# Patient Record
Sex: Male | Born: 1970 | Race: White | Hispanic: No | Marital: Married | Smoking: Never smoker
Health system: Southern US, Community
[De-identification: ages and names within clinical notes are randomized; demographics above are authoritative.]

## PROBLEM LIST (undated history)

## (undated) DIAGNOSIS — I83009 Varicose veins of unspecified lower extremity with ulcer of unspecified site: Secondary | ICD-10-CM

## (undated) DIAGNOSIS — K219 Gastro-esophageal reflux disease without esophagitis: Secondary | ICD-10-CM

## (undated) DIAGNOSIS — I82403 Acute embolism and thrombosis of unspecified deep veins of lower extremity, bilateral: Secondary | ICD-10-CM

## (undated) DIAGNOSIS — G4733 Obstructive sleep apnea (adult) (pediatric): Secondary | ICD-10-CM

## (undated) DIAGNOSIS — G4700 Insomnia, unspecified: Secondary | ICD-10-CM

## (undated) DIAGNOSIS — G2581 Restless legs syndrome: Secondary | ICD-10-CM

## (undated) DIAGNOSIS — G629 Polyneuropathy, unspecified: Secondary | ICD-10-CM

## (undated) HISTORY — DX: Gastro-esophageal reflux disease without esophagitis: K21.9

## (undated) HISTORY — DX: Polyneuropathy, unspecified: G62.9

## (undated) HISTORY — DX: Acute embolism and thrombosis of unspecified deep veins of lower extremity, bilateral: I82.403

## (undated) HISTORY — DX: Insomnia, unspecified: G47.00

## (undated) HISTORY — DX: Restless legs syndrome: G25.81

---

## 2002-08-21 HISTORY — PX: OTHER SURGICAL HISTORY: SHX169

## 2012-10-16 ENCOUNTER — Encounter (HOSPITAL_COMMUNITY): Payer: Self-pay | Admitting: Psychiatry

## 2012-10-16 ENCOUNTER — Ambulatory Visit (INDEPENDENT_AMBULATORY_CARE_PROVIDER_SITE_OTHER): Payer: No Typology Code available for payment source | Admitting: Psychiatry

## 2012-10-16 VITALS — BP 129/78 | HR 68 | Ht 70.0 in | Wt 277.0 lb

## 2012-10-16 DIAGNOSIS — F411 Generalized anxiety disorder: Secondary | ICD-10-CM

## 2012-10-16 DIAGNOSIS — F419 Anxiety disorder, unspecified: Secondary | ICD-10-CM

## 2012-10-16 DIAGNOSIS — F0630 Mood disorder due to known physiological condition, unspecified: Secondary | ICD-10-CM

## 2012-10-16 MED ORDER — RISPERIDONE 0.5 MG PO TABS: 1.5000 mg | ORAL_TABLET | Freq: Two times a day (BID) | ORAL | Status: DC

## 2012-10-16 NOTE — Progress Notes (Signed)
Psychiatric Assessment Adult  Patient Identification:  Miguel Obrien  Date of Evaluation:  10/16/2012  Chief Complaint:    Chief Complaint  Patient presents with  . Depression  . Anxiety  . Paranoid    History of Chief Complaint:  HPI Comments: Miguel Obrien is a 42 y/o  male with a past psychiatric history significant for symptoms of paranoia, anxiety and depression. The patient is referred for psychiatric services for psychiatric evaluation and medication management.   The patient reports that his main stressors are:  "employee at work"- The patient reports she "feels like people are talking about me all the time."  She further explains that "people at work use to talk to me, now they go around me and skip me when they talk. Had one supervisor tell me I'm not worth shit." She reports she is "getting to hate work, but I can't quit, so I keep going on."    Onset- Patient reports symptoms started 7-8 years ago. 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         Progression since onset-gradually worsening.         Symptoms-As noted above and below in Psychiatric review of symptoms.          Frequency-daily       Episode duration-minutes to hours.        Severity-mild          Aggravated by work stress.   Hours of sleep per night-varies depending on symptoms of RLS.       Sleep quality-Good to poor depending on RLS,           Nighttime awakenings-0-1 depending on symptoms of RLS.            Risk factors factors-none.  PMH includes-GERD, Neuropathy,DVT, RLS, Insomnia          Treatments tried-SSRIs, atypical antipsychotics   Compliance with treatment-100%           Improvement on treatment-mild           Past compliance problems-medication issues.     Review of Systems  Constitutional: Negative for fever, chills, activity change, appetite change and  fatigue.  Respiratory: Negative for cough, choking, chest tightness and shortness of breath.   Cardiovascular: Negative for chest pain, palpitations and leg swelling.  Gastrointestinal: Negative for nausea, vomiting, abdominal pain, diarrhea, constipation, blood in stool and abdominal distention.  Neurological: Positive for weakness (Due to periperal neuropathy in legs.). Negative for dizziness, light-headedness, numbness and headaches.   Filed Vitals:   10/16/12 0920  BP: 129/78  Pulse: 68  Height: 5\' 10"  (1.778 m)  Weight: 277 lb (125.646 kg)   Physical Exam  Vitals reviewed. Constitutional: He appears well-developed and well-nourished. No distress.  Skin: He is not diaphoretic.  Musculoskeletal: Strength & Muscle Tone: within normal limits Gait & Station: normal Patient leans: N/A   Depressive Symptoms: disturbed sleep,  (Hypo) Manic Symptoms:   Elevated Mood:  Negative Irritable Mood:  Negative Grandiosity:  Negative Distractibility:  Negative Labiality of Mood:  Negative Delusions:  Negative Hallucinations:  Negative Impulsivity:  Negative Sexually Inappropriate Behavior:  Negative Financial Extravagance:  Negative Flight of Ideas:  Negative  Anxiety Symptoms: Excessive Worry:  Yes Panic Symptoms:  Negative Agoraphobia:  Negative Obsessive Compulsive: Yes  Symptoms: Intrusive thoughts Specific Phobias:  Negative Social Anxiety:  Negative  Psychotic Symptoms:  Hallucinations: No None Delusions:  Yes-5 years ago, thought cameras were watching him for 4-5 months Paranoia:  Yes-people talking about him.   Ideas of Reference:  Negative  PTSD Symptoms: Ever had a traumatic exposure:  Negative Had a traumatic exposure in the last month:  Negative Re-experiencing: Negative None Hypervigilance:  Negative Hyperarousal: Negative None Avoidance: No None  Traumatic Brain Injury: Negative   Past Psychiatric History: Diagnosis: No previous psychiatric diagnosis.   Hospitalizations: Patient denies.  Outpatient Care: Patient denies.  Substance Abuse Care: Patient denies.  Self-Mutilation: Patient denies.  Suicidal Attempts: Patient denies.  Violent Behaviors: Patient denies.   Past Medical History:   Past Medical History  Diagnosis Date  . GERD (gastroesophageal reflux disease)     Diagnosed 4-5 years  . DVT, bilateral lower limbs About 10 years  . Peripheral neuropathy About 2 years  . Restless legs syndrome (RLS)   . Insomnia     History of Loss of Consciousness:  No Seizure History:  Yes-History of Absence seizures Cardiac History:  Negative  Allergies:  No Known Allergies  Current Medications:  Current Outpatient Prescriptions  Medication Sig Dispense Refill  . FLUoxetine (PROZAC) 40 MG capsule Take 40 mg by mouth every morning.      . gabapentin (NEURONTIN) 300 MG capsule Take 300 mg by mouth 2 (two) times daily.      . pantoprazole (PROTONIX) 40 MG tablet Take 40 mg by mouth daily.      . risperiDONE (RISPERDAL) 0.5 MG tablet Take 0.5 mg by mouth 2 (two) times daily.      Marland Kitchen warfarin (COUMADIN) 5 MG tablet Take 5 mg by mouth daily. 7.5 mg Monday to Friday, and 10 mg Saturday and Sunday.      . zolpidem (AMBIEN) 5 MG tablet Take 5 mg by mouth at bedtime as needed for sleep.       No current facility-administered medications for this visit.    Previous Psychotropic Medications:  Medication Dose   Zolpidem  5 mg  Abilify-Expensive-  Unknown possible 10 mg  Risperidone  1 mg  Fluoextine  40 mg   Substance Abuse History in the last 12 months: Caffeine: Coffee 1.5  cup per day. Nicotine: Patient denies.  Alcohol: Patient denies.  Illicit Drugs: Patient denies.   Medical Consequences of Substance Abuse: Patient denies.   Legal Consequences of Substance Abuse: Patient denies.   Family Consequences of Substance Abuse: Patient denies.   Blackouts:  Negative DT's:  Negative Withdrawal Symptoms:  Negative None  Social  History: Current Place of Residence: [place], [place] Place of Birth: [place], [place] Family Members: Patient lives with his. Marital Status:  Married Children: 1  Daughters: 1- from another woman Relationships: Patient reports his wife and mother are his main sources of emotional support. Education:  College-1 year Educational Problems/Performance: Fair Religious Beliefs/Practices: Patient prays. 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 Legal History: None Hobbies/Interests: Patient has fish aquarium  Family History:  History reviewed. No pertinent family history.  Mental Status Examination/Evaluation: Objective:  Appearance: Casual  Eye Contact::  Good  Speech:  Clear and Coherent and Normal Rate  Volume:  Normal  Mood:  "pretty good" 8/10  (0=Very depressed; 5=Neutral; 10=Very Happy)   Affect:  Appropriate, Congruent and Full Range  Thought Process:  Coherent, Linear and Logical  Orientation:  Full (Time, Place, and Person)  Thought Content:  Obsessions and Paranoid Ideation  Suicidal Thoughts:  No  Homicidal  Thoughts:  No  Judgement:  Fair  Insight:  Present  Psychomotor Activity:  Negative  Akathisia:  Yes  Handed:  Right  AIMS (if indicated):    Assets:  Communication Skills Desire for Improvement Financial Resources/Insurance Housing Intimacy Leisure Time Physical Health Resilience Social Support Talents/Skills Transportation Vocational/Educational    Laboratory/X-Ray Psychological Evaluation(s)   None  None   Assessment:   AXIS I Anxiety Disorder NOS rule out Mood Disorder Due to general medical condition.  AXIS II No diagnosis  AXIS III Past Medical History  Diagnosis Date  . GERD (gastroesophageal reflux disease)     Diagnosed 4-5 years  . DVT, bilateral lower limbs About 10 years  . Peripheral neuropathy About 2 years  . Restless legs syndrome (RLS)   . Insomnia      AXIS IV occupational  problems and other psychosocial or environmental problems  AXIS V 41-50 serious symptoms   Treatment Plan/Recommendations:   Plan of Care:  PLAN:  1. Affirm with the patient that the medications are taken as ordered. Patient  expressed understanding of how their medications were to be used.    Laboratory:   No labs warranted at this time.   Psychotherapy: Therapy: brief supportive therapy provided. Continue current services.   Medications:  Start the following psychiatric medications as written prior to this appointment:  a) Continue Prozac 40 mg b) Increase Risperidone 1.5  mg daily-Asked patient to report results in a week. -Risks and benefits, side effects and alternatives discussed with patient, he was given an opportunity to ask questions about his medication, illness, and treatment. All current psychiatric medications have been reviewed and discussed with the patient and adjusted as clinically appropriate. The patient has been provided an accurate and updated list of the medications being now prescribed.   Routine PRN Medications:  Negative  Consultations: The patient was encouraged to keep all PCP and specialty clinic appointments.   Safety Concerns:   Patient told to call clinic if any problems occur. Patient advised to go to  ER  if he should develop SI/HI, side effects, or if symptoms worsen. Has crisis numbers to call if needed.    Other:   8. Patient was instructed to return to clinic in 1 months.  9. The patient was advised to call and cancel their mental health appointment within 24 hours of the appointment, if they are unable to keep the appointment, as well as the three no show and termination from clinic policy. 10. The patient expressed understanding of the plan and agrees with the above.   Jacqulyn Cane, MD 5/28/20148:53 AM

## 2012-10-18 ENCOUNTER — Encounter (HOSPITAL_COMMUNITY): Payer: Self-pay | Admitting: Psychiatry

## 2012-10-18 DIAGNOSIS — F419 Anxiety disorder, unspecified: Secondary | ICD-10-CM | POA: Insufficient documentation

## 2012-10-18 DIAGNOSIS — F0630 Mood disorder due to known physiological condition, unspecified: Secondary | ICD-10-CM | POA: Insufficient documentation

## 2012-11-08 ENCOUNTER — Ambulatory Visit (HOSPITAL_COMMUNITY): Payer: Self-pay | Admitting: Psychiatry

## 2012-11-15 ENCOUNTER — Encounter (HOSPITAL_COMMUNITY): Payer: Self-pay | Admitting: Psychiatry

## 2012-11-15 ENCOUNTER — Ambulatory Visit (INDEPENDENT_AMBULATORY_CARE_PROVIDER_SITE_OTHER): Payer: No Typology Code available for payment source | Admitting: Psychiatry

## 2012-11-15 VITALS — BP 113/72 | HR 80 | Ht 70.0 in | Wt 278.0 lb

## 2012-11-15 DIAGNOSIS — F0630 Mood disorder due to known physiological condition, unspecified: Secondary | ICD-10-CM

## 2012-11-15 DIAGNOSIS — F411 Generalized anxiety disorder: Secondary | ICD-10-CM

## 2012-11-15 MED ORDER — FLUOXETINE HCL 40 MG PO CAPS: 40.0000 mg | ORAL_CAPSULE | ORAL | Status: DC

## 2012-11-15 MED ORDER — RISPERIDONE 0.5 MG PO TABS: 2.0000 mg | ORAL_TABLET | Freq: Every day | ORAL | Status: DC

## 2012-11-15 NOTE — Progress Notes (Signed)
[facility] Follow-up Outpatient Visit   Patient Identification:  Miguel Obrien  Date of Evaluation:  11/15/2012  Chief Complaint:    Chief Complaint  Patient presents with  . Follow-up    History of Chief Complaint:  HPI Comments: Mr. Miguel Obrien is a 42 y/o  male with a past psychiatric history significant for symptoms of paranoia, anxiety and depression. The patient is referred for psychiatric services for medication management.   The patient reports that he has been feeling better particularly with symptoms of paranoia since an increase of Risperidine to 1.5 mg.  The patient reports his appetite is fair.    The patient states he is taking all his medications and denies any side effects from his medications. He denies any other complaints.  In the area of affective symptoms, patient appears euthymic. Patient denies current suicidal ideation, intent, or plan. Patient denies current homicidal ideation, intent, or plan. Patient denies auditory hallucinations. Patient denies visual hallucinations. Patient denies symptoms of paranoia. Patient states sleep is good, with approximately 6-7 hours of sleep per night. Appetite is good. Energy level is fair. Patient denies symptoms of anhedonia. Patient denies hopelessness, helplessness, or guilt.   Denies any recent episodes consistent with mania, particularly decreased need for sleep with increased energy, grandiosity, impulsivity, hyperverbal and pressured speech, or increased productivity. Denies any recent symptoms consistent with psychosis, particularly auditory or visual hallucinations, thought broadcasting/insertion/withdrawal, or ideas of reference. Also denies excessive worry to the point of physical symptoms as well as any panic attacks. Denies any history of trauma or symptoms consistent with PTSD such as flashbacks, nightmares, hypervigilance, feelings of numbness or inability to connect with others.   Onset- Patient reports symptoms  started 7-8 years ago. 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         Progression since onset-gradually worsening.         Symptoms-As noted above and below in Psychiatric review of symptoms.          Frequency-daily       Episode duration-minutes to hours.        Severity-mild          Aggravated by work stress.   Hours of sleep per night-varies depending on symptoms of RLS.       Sleep quality-Good to poor depending on RLS,           Nighttime awakenings-0-1 depending on symptoms of RLS.            Risk factors factors-none.  PMH includes-GERD, Neuropathy,DVT, RLS, Insomnia          Treatments tried-SSRIs, atypical antipsychotics   Compliance with treatment-100%           Improvement on treatment-mild           Past compliance problems-medication issues.     Review of Systems  Constitutional: Negative for fever, chills, activity change, appetite change and fatigue.  Respiratory: Negative for cough, choking, chest tightness and shortness of breath.   Cardiovascular: Negative for chest pain, palpitations and leg swelling.  Gastrointestinal: Negative for nausea, vomiting, abdominal pain, diarrhea, constipation, blood in stool and abdominal distention.  Neurological: Positive for weakness (Due to periperal neuropathy in legs.). Negative for dizziness, light-headedness, numbness and headaches.   Filed Vitals:   11/15/12 1543  BP: 113/72  Pulse: 80  Height: 5\' 10"  (1.778 m)  Weight:  278 lb (126.1 kg)    Physical Exam  Vitals reviewed. Constitutional: He appears well-developed and well-nourished. No distress.  Skin: He is not diaphoretic.  Musculoskeletal: Strength & Muscle Tone: within normal limits Gait & Station: normal Patient leans: N/A   Depressive Symptoms: disturbed sleep,  (Hypo) Manic Symptoms:   Elevated Mood:  Negative Irritable Mood:   Negative Grandiosity:  Negative Distractibility:  Negative Labiality of Mood:  Negative Delusions:  Negative Hallucinations:  Negative Impulsivity:  Negative Sexually Inappropriate Behavior:  Negative Financial Extravagance:  Negative Flight of Ideas:  Negative  Anxiety Symptoms: Excessive Worry:  Yes Panic Symptoms:  Negative Agoraphobia:  Negative Obsessive Compulsive: Yes  Symptoms: Intrusive thoughts Specific Phobias:  Negative Social Anxiety:  Negative  Psychotic Symptoms:  Hallucinations: No None Delusions:  Yes-5 years ago, thought cameras were watching him for 4-5 months Paranoia:  Yes-people talking about him.   Ideas of Reference:  Negative  PTSD Symptoms: Ever had a traumatic exposure:  Negative Had a traumatic exposure in the last month:  Negative Re-experiencing: Negative None Hypervigilance:  Negative Hyperarousal: Negative None Avoidance: No None  Traumatic Brain Injury: Negative   Past Psychiatric History: Diagnosis: No previous psychiatric diagnosis.  Hospitalizations: Patient denies.  Outpatient Care: Patient denies.  Substance Abuse Care: Patient denies.  Self-Mutilation: Patient denies.  Suicidal Attempts: Patient denies.  Violent Behaviors: Patient denies.   Past Medical History:   Past Medical History  Diagnosis Date  . GERD (gastroesophageal reflux disease)     Diagnosed 4-5 years  . DVT, bilateral lower limbs About 10 years  . Peripheral neuropathy About 2 years  . Restless legs syndrome (RLS)   . Insomnia     History of Loss of Consciousness:  No Seizure History:  Yes-History of Absence seizures Cardiac History:  Negative  Allergies:  No Known Allergies  Current Medications:  Current Outpatient Prescriptions  Medication Sig Dispense Refill  . FLUoxetine (PROZAC) 40 MG capsule Take 40 mg by mouth every morning.      . gabapentin (NEURONTIN) 300 MG capsule Take 300 mg by mouth 2 (two) times daily.      . pantoprazole  (PROTONIX) 40 MG tablet Take 40 mg by mouth daily.      . risperiDONE (RISPERDAL) 0.5 MG tablet Take 3 tablets (1.5 mg total) by mouth 2 (two) times daily.  1 tablet  0  . warfarin (COUMADIN) 5 MG tablet Take 5 mg by mouth daily. 7.5 mg Monday to Friday, and 10 mg Saturday and Sunday.       No current facility-administered medications for this visit.    Previous Psychotropic Medications:  Medication Dose   Zolpidem  5 mg  Abilify-Expensive-  Unknown possible 10 mg  Risperidone  1 mg  Fluoextine  40 mg   Substance Abuse History in the last 12 months: Caffeine: Coffee 1.5  cup per day. Nicotine: Patient denies.  Alcohol: Patient denies.  Illicit Drugs: Patient denies.   Medical Consequences of Substance Abuse: Patient denies.   Legal Consequences of Substance Abuse: Patient denies.   Family Consequences of Substance Abuse: Patient denies.   Blackouts:  Negative DT's:  Negative Withdrawal Symptoms:  Negative None  Social History: Current Place of Residence: [place], [place] Place of Birth: [place], [place] Family Members: Patient lives with his. Marital Status:  Married Children: 1  Daughters: 1- from another woman Relationships: Patient reports his wife and mother are his main sources of emotional support. Education:  College-1 year Educational Problems/Performance: Fair Religious  Beliefs/Practices: Patient prays. 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 Legal History: None Hobbies/Interests: Patient has fish aquarium  Family History:   Family History  Problem Relation Age of Onset  . Diabetes Mellitus II Mother   . Diabetes Mellitus II Father   . Deep vein thrombosis Brother     Psychiatric Specialty Examination: Objective:  Appearance: Casual  Eye Contact::  Good  Speech:  Clear and Coherent and Normal Rate  Volume:  Normal  Mood:  "pretty good" 8/10  (0=Very depressed; 5=Neutral; 10=Very Happy)   Affect:   Appropriate, Congruent and Full Range  Thought Process:  Coherent, Linear and Logical  Orientation:  Full (Time, Place, and Person)  Thought Content:  Obsessions and Paranoid Ideation  Suicidal Thoughts:  No  Homicidal Thoughts:  No  Judgement:  Fair  Insight:  Present  Psychomotor Activity:  Negative  Akathisia:  Yes  Handed:  Right  Memory: Immediate: 3/3; Immediate 1/3  AIMS (if indicated):  As noted  Assets:  Communication Skills Desire for Improvement Financial Resources/Insurance Housing Intimacy Leisure Time Physical Health Resilience Social Support Talents/Skills Transportation Vocational/Educational    Laboratory/X-Ray Psychological Evaluation(s)   None  None   Assessment:   AXIS I Anxiety Disorder NOS rule out Mood Disorder Due to general medical condition.  AXIS II No diagnosis  AXIS III Past Medical History  Diagnosis Date  . GERD (gastroesophageal reflux disease)     Diagnosed 4-5 years  . DVT, bilateral lower limbs About 10 years  . Peripheral neuropathy About 2 years  . Restless legs syndrome (RLS)   . Insomnia      AXIS IV occupational problems and other psychosocial or environmental problems  AXIS V 41-50 serious symptoms   Treatment Plan/Recommendations:   Plan of Care:  PLAN:  1. Affirm with the patient that the medications are taken as ordered. Patient  expressed understanding of how their medications were to be used.    Laboratory:   No labs warranted at this time.   Psychotherapy: Therapy: brief supportive therapy provided. Continue current services.   Medications:  Start the following psychiatric medications as written prior to this appointment:  a) Continue Prozac 40 mg b) Increase Risperidone 2 mg daily-Asked patient to report results in a week. -Risks and benefits, side effects and alternatives discussed with patient, he was given an opportunity to ask questions about his medication, illness, and treatment. All current psychiatric  medications have been reviewed and discussed with the patient and adjusted as clinically appropriate. The patient has been provided an accurate and updated list of the medications being now prescribed.   Routine PRN Medications:  Negative  Consultations: The patient was encouraged to keep all PCP and specialty clinic appointments.   Safety Concerns:   Patient told to call clinic if any problems occur. Patient advised to go to  ER  if he should develop SI/HI, side effects, or if symptoms worsen. Has crisis numbers to call if needed.    Other:   8. Patient was instructed to return to clinic in 6 weeks.  9. The patient was advised to call and cancel their mental health appointment within 24 hours of the appointment, if they are unable to keep the appointment, as well as the three no show and termination from clinic policy. 10. The patient expressed understanding of the plan and agrees with the above.   Jacqulyn Cane, MD 6/27/20143:41 PM

## 2012-12-27 ENCOUNTER — Encounter (HOSPITAL_COMMUNITY): Payer: Self-pay | Admitting: Psychiatry

## 2012-12-27 ENCOUNTER — Ambulatory Visit (INDEPENDENT_AMBULATORY_CARE_PROVIDER_SITE_OTHER): Payer: No Typology Code available for payment source | Admitting: Psychiatry

## 2012-12-27 VITALS — BP 125/75 | HR 75 | Ht 70.0 in | Wt 289.0 lb

## 2012-12-27 DIAGNOSIS — F411 Generalized anxiety disorder: Secondary | ICD-10-CM

## 2012-12-27 DIAGNOSIS — F0630 Mood disorder due to known physiological condition, unspecified: Secondary | ICD-10-CM

## 2012-12-27 MED ORDER — FLUOXETINE HCL 40 MG PO CAPS: 40.0000 mg | ORAL_CAPSULE | ORAL | Status: DC

## 2012-12-27 MED ORDER — RISPERIDONE 2 MG PO TABS: 2.0000 mg | ORAL_TABLET | Freq: Every day | ORAL | Status: DC

## 2012-12-27 NOTE — Progress Notes (Signed)
[facility] Follow-up Outpatient Visit   Patient Identification:  Miguel Obrien  Date of Evaluation:  12/27/2012  Chief Complaint:    Chief Complaint  Patient presents with  . 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 The patient is referred for psychiatric services for medication management.   The patient reports he has been doing better with the increase in Risperidone 2 mg.  He continues to take 40 mg of fluoxetine. He reports he is talking to people at work and able to interact with people he had difficulty with before. The patient states he is taking all his medications and denies any side effects from his medications. He denies any other complaints.   In the area of affective symptoms, patient appears euthymic. Patient denies current suicidal ideation, intent, or plan. Patient denies current homicidal ideation, intent, or plan. Patient denies auditory hallucinations. Patient denies visual hallucinations. Patient denies symptoms of paranoia. Patient states sleep is good, with approximately 7 hours of sleep per night. Appetite is good.  Energy level is good. Patient denies symptoms of anhedonia. Patient denies hopelessness, helplessness, or guilt.      Progression since onset-gradually improving         Symptoms-As noted above and below in Psychiatric review of symptoms.          Frequency-daily       Episode duration-minutes to hours.        Severity-mild          Aggravated-nothing.   Hours of sleep per night-varies depending on symptoms of RLS.       Sleep quality-Good to poor depending on RLS,           Nighttime awakenings-0-1 depending on symptoms of RLS.            Risk factors factors-none.  PMH includes-GERD, Neuropathy,DVT, RLS, Insomnia           Treatments tried-SSRIs, atypical antipsychotics   Compliance with treatment-100%           Improvement on treatment-significant.        Past compliance problems-medication issues.     Review of Systems  Constitutional: Negative for fever, chills, activity change, appetite change and fatigue.  Respiratory: Negative for cough, choking, chest tightness and shortness of breath.   Cardiovascular: Negative for chest pain, palpitations and leg swelling.  Gastrointestinal: Negative for nausea, vomiting, abdominal pain, diarrhea, constipation, blood in stool and abdominal distention.  Neurological: Positive for weakness (Due to periperal neuropathy in legs.). Negative for dizziness, light-headedness, numbness and headaches.   Filed Vitals:   12/27/12 1518  BP: 125/75  Pulse: 75  Height: 5\' 10"  (1.778 m)  Weight: 289 lb (131.09 kg)    Physical Exam  Vitals reviewed. Constitutional: He appears well-developed and well-nourished. No distress.  Skin: He is not diaphoretic.  Musculoskeletal: Strength & Muscle Tone: within normal limits Gait & Station: normal Patient leans: N/A   Depressive Symptoms: disturbed sleep,  (Hypo) Manic Symptoms:   Elevated Mood:  Negative Irritable Mood:  Negative Grandiosity:  Negative Distractibility:  Negative Labiality of Mood:  Negative Delusions:  Negative Hallucinations:  Negative Impulsivity:  Negative Sexually Inappropriate Behavior:  Negative Financial Extravagance:  Negative Flight of Ideas:  Negative  Anxiety Symptoms: Excessive Worry:  Negative Panic Symptoms:  Negative Agoraphobia:  Negative Obsessive Compulsive: No  Symptoms: None, Specific Phobias:  Negative Social Anxiety:  Negative  Psychotic Symptoms:  Hallucinations: No None Delusions:  Yes-5 years ago, thought cameras were watching him for 4-5 months Paranoia:  Yes-people talking about him.   Ideas of Reference:  Negative  PTSD Symptoms: Ever had a traumatic  exposure:  Negative Had a traumatic exposure in the last month:  Negative Re-experiencing: Negative None Hypervigilance:  Negative Hyperarousal: Negative None Avoidance: No None  Traumatic Brain Injury: Negative   Past Psychiatric History: Diagnosis: No previous psychiatric diagnosis.  Hospitalizations: Patient denies.  Outpatient Care: Patient denies.  Substance Abuse Care: Patient denies.  Self-Mutilation: Patient denies.  Suicidal Attempts: Patient denies.  Violent Behaviors: Patient denies.   Past Medical History:   Past Medical History  Diagnosis Date  . GERD (gastroesophageal reflux disease)     Diagnosed 4-5 years  . DVT, bilateral lower limbs About 10 years  . Peripheral neuropathy About 2 years  . Restless legs syndrome (RLS)   . Insomnia     History of Loss of Consciousness:  No Seizure History:  Yes-History of Absence seizures Cardiac History:  Negative  Allergies:  No Known Allergies  Current Medications:  Current Outpatient Prescriptions  Medication Sig Dispense Refill  . FLUoxetine (PROZAC) 40 MG capsule Take 1 capsule (40 mg total) by mouth every morning.  30 capsule  1  . gabapentin (NEURONTIN) 300 MG capsule Take 300 mg by mouth 2 (two) times daily.      . pantoprazole (PROTONIX) 40 MG tablet Take 40 mg by mouth daily.      . risperiDONE (RISPERDAL) 0.5 MG tablet Take 4 tablets (2 mg total) by mouth at bedtime.  1 tablet  0  . warfarin (COUMADIN) 5 MG tablet Take 5 mg by mouth daily. 7.5 mg Monday to Friday, and 10 mg Saturday and Sunday.       No current facility-administered medications for this visit.    Previous Psychotropic Medications:  Medication Dose   Zolpidem  5 mg  Abilify-Expensive-  Unknown possible 10 mg  Risperidone  1 mg  Fluoextine  40 mg   Substance Abuse History in the last 12 months: Caffeine: Coffee 1.5  cup per day. Nicotine: Patient denies.  Alcohol: Patient denies.  Illicit Drugs: Patient denies.   Medical  Consequences of Substance Abuse: Patient denies.   Legal Consequences of Substance Abuse: Patient denies.   Family Consequences of Substance Abuse: Patient denies.   Blackouts:  Negative DT's:  Negative Withdrawal Symptoms:  Negative None  Social History: Current Place of Residence: [place], [place] Place of Birth: [place], [place] Family Members: Patient lives with his. Marital Status:  Married Children: 1  Daughters: 1- from another woman Relationships: Patient reports his wife and mother are his main sources of emotional support. Education:  College-1 year Educational Problems/Performance: Fair Religious Beliefs/Practices: Patient prays. 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 Legal History: None Hobbies/Interests: Patient has fish aquarium  Family History:   Family History  Problem Relation Age of Onset  . Diabetes Mellitus II Mother   . Diabetes Mellitus II Father   . Deep vein thrombosis Brother     Psychiatric Specialty Examination: Objective:  Appearance: Casual  Eye Contact::  Good  Speech:  Clear and Coherent and Normal Rate  Volume:  Normal  Mood:  "Still pretty good" 8/10  (0=Very depressed; 5=Neutral; 10=Very Happy)   Affect:  Appropriate, Congruent and  Full Range  Thought Process:  Coherent, Linear and Logical  Orientation:  Full (Time, Place, and Person)  Thought Content:  Obsessions and Paranoid Ideation  Suicidal Thoughts:  No  Homicidal Thoughts:  No  Judgement:  Fair  Insight:  Present  Psychomotor Activity:  Negative  Akathisia:  Yes  Handed:  Right  Memory: Immediate: 3/3; Immediate 2/3  AIMS (if indicated):  As noted in chart.  Assets:  Communication Skills Desire for Improvement Financial Resources/Insurance Housing Intimacy Leisure Time Physical Health Resilience Social Support Talents/Skills Transportation Vocational/Educational    Laboratory/X-Ray Psychological Evaluation(s)    None  None   Assessment:   AXIS I Anxiety Disorder, Not Elsewhere Classified.,Mood Disorder Due to general medical condition (history of Seizure Disorder)  AXIS II No diagnosis  AXIS III Past Medical History  Diagnosis Date  . GERD (gastroesophageal reflux disease)     Diagnosed 4-5 years  . DVT, bilateral lower limbs About 10 years  . Peripheral neuropathy About 2 years  . Restless legs syndrome (RLS)   . Insomnia   History of seizure disorder-Status Post Right temporal lobectomy.   AXIS IV occupational problems and other psychosocial or environmental problems  AXIS V 41-50 serious symptoms   Treatment Plan/Recommendations:   Plan of Care:  PLAN:  1. Affirm with the patient that the medications are taken as ordered. Patient  expressed understanding of how their medications were to be used.    Laboratory:   No labs warranted at this time.   Psychotherapy: Therapy: brief supportive therapy provided. Continue current services.   Medications:  Continue the following psychiatric medications as written prior to this appointment:  a) Continue Prozac 40 mg b) Continue Risperidone 2 mg daily-will hold at this dose and consider increase as needed.  -Risks and benefits, side effects and alternatives discussed with patient, he was given an opportunity to ask questions about his medication, illness, and treatment. All current psychiatric medications have been reviewed and discussed with the patient and adjusted as clinically appropriate. The patient has been provided an accurate and updated list of the medications being now prescribed.   Routine PRN Medications:  Negative  Consultations: The patient was encouraged to keep all PCP and specialty clinic appointments.   Safety Concerns:   Patient told to call clinic if any problems occur. Patient advised to go to  ER  if he should develop SI/HI, side effects, or if symptoms worsen. Has crisis numbers to call if needed.    Other:   8.  Patient was instructed to return to clinic in 3 months.  9. The patient was advised to call and cancel their mental health appointment within 24 hours of the appointment, if they are unable to keep the appointment, as well as the three no show and termination from clinic policy. 10. The patient expressed understanding of the plan and agrees with the above.   Jacqulyn Cane, MD 8/8/20143:16 PM

## 2012-12-29 DIAGNOSIS — F411 Generalized anxiety disorder: Secondary | ICD-10-CM | POA: Insufficient documentation

## 2013-03-28 ENCOUNTER — Other Ambulatory Visit (HOSPITAL_COMMUNITY): Payer: Self-pay | Admitting: Psychiatry

## 2013-03-28 ENCOUNTER — Ambulatory Visit (INDEPENDENT_AMBULATORY_CARE_PROVIDER_SITE_OTHER): Payer: No Typology Code available for payment source | Admitting: Psychiatry

## 2013-03-28 ENCOUNTER — Encounter (HOSPITAL_COMMUNITY): Payer: Self-pay | Admitting: Psychiatry

## 2013-03-28 VITALS — BP 120/78 | HR 77 | Ht 70.0 in | Wt 288.0 lb

## 2013-03-28 DIAGNOSIS — F0630 Mood disorder due to known physiological condition, unspecified: Secondary | ICD-10-CM

## 2013-03-28 DIAGNOSIS — F419 Anxiety disorder, unspecified: Secondary | ICD-10-CM

## 2013-03-28 DIAGNOSIS — F39 Unspecified mood [affective] disorder: Secondary | ICD-10-CM

## 2013-03-28 DIAGNOSIS — Z87898 Personal history of other specified conditions: Secondary | ICD-10-CM

## 2013-03-28 DIAGNOSIS — F411 Generalized anxiety disorder: Secondary | ICD-10-CM

## 2013-03-28 MED ORDER — RISPERIDONE 2 MG PO TABS: 2.0000 mg | ORAL_TABLET | Freq: Every day | ORAL | Status: DC

## 2013-03-28 MED ORDER — FLUOXETINE HCL 40 MG PO CAPS: 40.0000 mg | ORAL_CAPSULE | ORAL | Status: DC

## 2013-03-28 NOTE — Progress Notes (Signed)
[facility]-up Outpatient Visit  M[facility]uel E Maj[facility] Obrien   Patient Identification:  Miguel Obrien  Date of Evaluation:  03/28/2013  Chief Complaint:    Chief Complaint  Patient presents with  . 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 The patient is referred for psychiatric services for medication management.   The patient reports he has been doing better with the increase in Risperidone 2 mg.  He continues to take 40 mg of fluoxetine. He reports he continues to talk to people e at work and is able to interact with people he had difficulty with before. The patient states he is taking all his medications and denies any side effects from his medications. He denies any recent irritability or agitation.  He reports he is taking his medications a denies any side effects. He denies any other complaints.   In the area of affective symptoms, patient appears euthymic. Patient denies current suicidal ideation, intent, or plan. Patient denies current homicidal ideation, intent, or plan. Patient denies auditory hallucinations. Patient denies visual hallucinations. Patient denies symptoms of paranoia. Patient states sleep is good, with approximately 7-8 hours of sleep per night. Appetite is good.  Energy level is good. Patient denies symptoms of anhedonia. Patient denies hopelessness, helplessness, or guilt.      Progression since onset-improving         Symptoms-As noted above and below in Psychiatric review of symptoms.          Frequency-None  Episode duration-has not occurred      Severity-mild to none        Aggravated-nothing.   Hours of sleep per night-varies depending on symptoms of RLS.       Sleep quality-Good to poor depending on RLS, has been  doing well since he has been taking his medications.      Nighttime awakenings-0-1 depending on symptoms of RLS.            Risk factors factors-none.  PMH includes-GERD, Neuropathy,DVT, RLS, Insomnia          Treatments tried-SSRIs, atypical antipsychotics   Compliance with treatment-100%           Improvement on treatment-significant.        Past compliance problems-medication issues.     Review of Systems  Constitutional: Negative for fever, chills, activity change, appetite change and fatigue.  Respiratory: Negative for cough, choking, chest tightness and shortness of breath.   Cardiovascular: Negative for chest pain, palpitations and leg swelling.  Gastrointestinal: Negative for nausea, vomiting, abdominal pain, diarrhea, constipation, blood in stool and abdominal distention.  Neurological: Positive for weakness (Due to periperal neuropathy in legs.). Negative for dizziness, light-headedness, numbness and headaches.   Filed Vitals:   03/28/13 1501  BP: 120/78  Pulse: 77  Height: 5\' 10"  (1.778 m)  Weight: 288 lb (130.636 kg)    Physical Exam  Vitals reviewed. Constitutional: He appears well-developed and well-nourished. No distress.  Skin: He is not diaphoretic.  Musculoskeletal: Strength & Muscle Tone: within normal limits Gait & Station: normal Patient leans: N/A   Depressive Symptoms: disturbed sleep,  (Hypo) Manic Symptoms:   Elevated Mood:  Negative Irritable Mood:  Negative Grandiosity:  Negative Distractibility:  Negative Labiality of Mood:  Negative Delusions:  Negative Hallucinations:  Negative Impulsivity:  Negative Sexually Inappropriate Behavior:  Negative Financial Extravagance:  Negative Flight of Ideas:  Negative  Anxiety Symptoms: Excessive Worry:  Negative Panic Symptoms:  Negative Agoraphobia:  Negative Obsessive Compulsive: No  Symptoms: None, Specific Phobias:  Negative Social Anxiety:  Negative  Psychotic Symptoms:   Hallucinations: No None Delusions:  Yes-5 years ago, thought cameras were watching him for 4-5 months Paranoia:  Yes-people talking about him.   Ideas of Reference:  Negative  PTSD Symptoms: Ever had a traumatic exposure:  Negative Had a traumatic exposure in the last month:  Negative Re-experiencing: Negative None Hypervigilance:  Negative Hyperarousal: Negative None Avoidance: No None  Traumatic Brain Injury: Negative   Past Psychiatric History: Diagnosis: No previous psychiatric diagnosis.  Hospitalizations: Patient denies.  Outpatient Care: Patient denies.  Substance Abuse Care: Patient denies.  Self-Mutilation: Patient denies.  Suicidal Attempts: Patient denies.  Violent Behaviors: Patient denies.   Past Medical History:   Past Medical History  Diagnosis Date  . GERD (gastroesophageal reflux disease)     Diagnosed 4-5 years  . DVT, bilateral lower limbs About 10 years  . Peripheral neuropathy About 2 years  . Restless legs syndrome (RLS)   . Insomnia    PCP: Dr. Joycelyn Man  History of Loss of Consciousness:  No Seizure History:  Yes-History of Absence seizures Cardiac History:  Negative  Allergies:  No Known Allergies  Current Medications:  Current Outpatient Prescriptions  Medication Sig Dispense Refill  . FLUoxetine (PROZAC) 40 MG capsule Take 1 capsule (40 mg total) by mouth every morning.  30 capsule  3  . gabapentin (NEURONTIN) 300 MG capsule Take 300 mg by mouth 2 (two) times daily.      . pantoprazole (PROTONIX) 40 MG tablet Take 40 mg by mouth daily.      . risperiDONE (RISPERDAL) 2 MG tablet Take 1 tablet (2 mg total) by mouth at bedtime.  30 tablet  3  . warfarin (COUMADIN) 5 MG tablet Take 5 mg by mouth daily. 7.5 mg Monday to Friday, and 10 mg Saturday and Sunday.       No current facility-administered medications for this visit.    Previous Psychotropic Medications:  Medication Dose   Zolpidem  5 mg  Abilify-Expensive-  Unknown possible 10  mg  Risperidone  1 mg  Fluoextine  40 mg   Substance Abuse History in the last 12 months: Caffeine: Coffee 1.5  cup per day. Nicotine: Patient denies.  Alcohol: Patient denies.  Illicit Drugs: Patient denies.   Medical Consequences of Substance Abuse: Patient denies.   Legal Consequences of Substance Abuse: Patient denies.   Family Consequences of Substance Abuse: Patient denies.   Blackouts:  Negative DT's:  Negative Withdrawal Symptoms:  Negative None  Social History: Current Place of Residence: [place], [place] Place of Birth: [place], [place] Family Members: Patient lives with his. Marital Status:  Married Children: 1  Daughters: 1- from another woman Relationships: Patient reports his wife and mother are his main sources of emotional support. Education:  College-1 year Educational Problems/Performance: Fair Religious Beliefs/Practices: Patient prays. 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 Legal History: None Hobbies/Interests: Patient has fish aquarium  Family History:   Family History  Problem Relation Age of Onset  . Diabetes Mellitus II Mother   . Diabetes Mellitus II Father   . Deep vein thrombosis Brother     Psychiatric Specialty Examination: Objective:  Appearance: Casual  Eye Contact::  Good  Speech:  Clear and Coherent and Normal Rate  Volume:  Normal  Mood:  "pretty good"  Depression: 8/10 (0=Very depressed; 5=Neutral; 10=Very Happy)  Anxiety- 0/10 (0=no anxiety; 5= moderate/tolerable anxiety; 10= panic attacks)   Affect:  Appropriate, Congruent and Full Range  Thought Process:  Coherent, Linear and Logical  Orientation:  Full (Time, Place, and Person)  Thought Content:  Obsessions and Paranoid Ideation  Suicidal Thoughts:  No  Homicidal Thoughts:  No  Judgement:  Fair  Insight:  Present  Psychomotor Activity:  Negative  Akathisia:  Yes  Handed:  Right  Memory: Immediate: 3/3; Immediate  3/3  AIMS (if indicated):  As noted in chart.  Assets:  Communication Skills Desire for Improvement Financial Resources/Insurance Housing Intimacy Leisure Time Physical Health Resilience Social Support Talents/Skills Transportation Vocational/Educational    Laboratory/X-Ray Psychological Evaluation(s)   None  None   Assessment:   AXIS I Anxiety Disorder, Not Elsewhere Classified.,Mood Disorder Due to general medical condition (history of Seizure Disorder)   Treatment Plan/Recommendations:   Plan of Care:  PLAN:  1. Affirm with the patient that the medications are taken as ordered. Patient  expressed understanding of how their medications were to be used.    Laboratory:   Will order FLP, FBS, HbA1c, he has not eating today so can do theses labs today.   Psychotherapy: Therapy: brief supportive therapy provided. Continue current services.   Medications:  Continue the following psychiatric medications as written prior to this appointment:  a) Continue Prozac 40 mg-No change in dose b) Continue Risperidone 2 mg daily-will hold at this dose and consider increase as needed. No change in dose -Risks and benefits, side effects and alternatives discussed with patient, he was given an opportunity to ask questions about his medication, illness, and treatment. All current psychiatric medications have been reviewed and discussed with the patient and adjusted as clinically appropriate. The patient has been provided an accurate and updated list of the medications being now prescribed.   Routine PRN Medications:  Negative  Consultations: The patient was encouraged to keep all PCP and specialty clinic appointments.   Safety Concerns:   Patient told to call clinic if any problems occur. Patient advised to go to  ER  if he should develop SI/HI, side effects, or if symptoms worsen. Has crisis numbers to call if needed.    Other:   8. Patient was instructed to return to clinic in 3 months.   9. The patient was advised to call and cancel their mental health appointment within 24 hours of the appointment, if they are unable to keep the appointment, as well as the three no show and termination from clinic policy. 10. The patient expressed understanding of the plan and agrees with the above.   Jacqulyn Cane, MD 11/7/20142:52 PM

## 2013-03-29 LAB — LIPID PANEL
Cholesterol: 177 mg/dL (ref 0–200)
HDL: 41 mg/dL (ref >39)
Total CHOL/HDL Ratio: 4.3 Ratio
Triglycerides: 143 mg/dL (ref <150)

## 2013-03-29 LAB — HEMOGLOBIN A1C: Hgb A1c MFr Bld: 5.7 % — ABNORMAL HIGH (ref <5.7)

## 2013-04-01 ENCOUNTER — Telehealth (HOSPITAL_COMMUNITY): Payer: Self-pay | Admitting: Psychiatry

## 2013-04-01 NOTE — Telephone Encounter (Signed)
Called patient, to discuss labs, no number was working.

## 2013-06-27 ENCOUNTER — Ambulatory Visit (HOSPITAL_COMMUNITY): Payer: Self-pay | Admitting: Psychiatry

## 2013-08-17 ENCOUNTER — Other Ambulatory Visit (HOSPITAL_COMMUNITY): Payer: Self-pay | Admitting: Psychiatry

## 2013-08-17 DIAGNOSIS — F0630 Mood disorder due to known physiological condition, unspecified: Secondary | ICD-10-CM

## 2013-08-19 MED ORDER — RISPERIDONE 2 MG PO TABS: 2.0000 mg | ORAL_TABLET | Freq: Every day | ORAL | Status: AC

## 2013-08-19 NOTE — Telephone Encounter (Signed)
Refill request. Provides 3 months.

## 2019-07-01 ENCOUNTER — Encounter (HOSPITAL_BASED_OUTPATIENT_CLINIC_OR_DEPARTMENT_OTHER): Payer: Self-pay | Admitting: *Deleted

## 2019-07-01 ENCOUNTER — Emergency Department (HOSPITAL_BASED_OUTPATIENT_CLINIC_OR_DEPARTMENT_OTHER)
Admission: EM | Admit: 2019-07-01 | Discharge: 2019-07-01 | Disposition: A | Discharge status: Home / Self Care | Payer: Medicare HMO | Attending: Emergency Medicine | Admitting: Emergency Medicine

## 2019-07-01 ENCOUNTER — Emergency Department (HOSPITAL_BASED_OUTPATIENT_CLINIC_OR_DEPARTMENT_OTHER): Payer: Medicare HMO

## 2019-07-01 ENCOUNTER — Other Ambulatory Visit: Payer: Self-pay

## 2019-07-01 DIAGNOSIS — R197 Diarrhea, unspecified: Secondary | ICD-10-CM | POA: Insufficient documentation

## 2019-07-01 DIAGNOSIS — R1013 Epigastric pain: Secondary | ICD-10-CM | POA: Diagnosis present

## 2019-07-01 DIAGNOSIS — Z79899 Other long term (current) drug therapy: Secondary | ICD-10-CM | POA: Diagnosis not present

## 2019-07-01 DIAGNOSIS — R0602 Shortness of breath: Secondary | ICD-10-CM | POA: Insufficient documentation

## 2019-07-01 DIAGNOSIS — R1084 Generalized abdominal pain: Secondary | ICD-10-CM | POA: Diagnosis not present

## 2019-07-01 DIAGNOSIS — Z86718 Personal history of other venous thrombosis and embolism: Secondary | ICD-10-CM | POA: Insufficient documentation

## 2019-07-01 DIAGNOSIS — R791 Abnormal coagulation profile: Secondary | ICD-10-CM | POA: Diagnosis not present

## 2019-07-01 DIAGNOSIS — Z7901 Long term (current) use of anticoagulants: Secondary | ICD-10-CM | POA: Diagnosis not present

## 2019-07-01 HISTORY — DX: Morbid (severe) obesity due to excess calories: E66.01

## 2019-07-01 HISTORY — DX: Varicose veins of unspecified lower extremity with ulcer of unspecified site: I83.009

## 2019-07-01 HISTORY — DX: Obstructive sleep apnea (adult) (pediatric): G47.33

## 2019-07-01 LAB — CBC WITH DIFFERENTIAL/PLATELET
Abs Immature Granulocytes: 0.26 10*3/uL — ABNORMAL HIGH (ref 0.00–0.07)
Basophils Absolute: 0.1 10*3/uL (ref 0.0–0.1)
Basophils Relative: 1 %
Eosinophils Absolute: 0.3 10*3/uL (ref 0.0–0.5)
Eosinophils Relative: 2 %
HCT: 44.9 % (ref 39.0–52.0)
Hemoglobin: 14.8 g/dL (ref 13.0–17.0)
Immature Granulocytes: 2 %
Lymphocytes Relative: 28 %
Lymphs Abs: 3.1 10*3/uL (ref 0.7–4.0)
MCH: 31.7 pg (ref 26.0–34.0)
MCHC: 33 g/dL (ref 30.0–36.0)
MCV: 96.1 fL (ref 80.0–100.0)
Monocytes Absolute: 1.3 10*3/uL — ABNORMAL HIGH (ref 0.1–1.0)
Monocytes Relative: 12 %
Neutro Abs: 5.9 10*3/uL (ref 1.7–7.7)
Neutrophils Relative %: 55 %
Platelets: 273 10*3/uL (ref 150–400)
RBC: 4.67 MIL/uL (ref 4.22–5.81)
RDW: 14.2 % (ref 11.5–15.5)
WBC: 11 10*3/uL — ABNORMAL HIGH (ref 4.0–10.5)
nRBC: 0 % (ref 0.0–0.2)

## 2019-07-01 LAB — COMPREHENSIVE METABOLIC PANEL
ALT: 41 U/L (ref 0–44)
AST: 36 U/L (ref 15–41)
Albumin: 3.8 g/dL (ref 3.5–5.0)
Alkaline Phosphatase: 51 U/L (ref 38–126)
Anion gap: 7 (ref 5–15)
BUN: 14 mg/dL (ref 6–20)
CO2: 22 mmol/L (ref 22–32)
Calcium: 8.7 mg/dL — ABNORMAL LOW (ref 8.9–10.3)
Chloride: 108 mmol/L (ref 98–111)
Creatinine, Ser: 1.06 mg/dL (ref 0.61–1.24)
GFR calc Af Amer: >60 mL/min (ref >60)
GFR calc non Af Amer: >60 mL/min (ref >60)
Glucose, Bld: 97 mg/dL (ref 70–99)
Potassium: 3.7 mmol/L (ref 3.5–5.1)
Sodium: 137 mmol/L (ref 135–145)
Total Bilirubin: 0.9 mg/dL (ref 0.3–1.2)
Total Protein: 7 g/dL (ref 6.5–8.1)

## 2019-07-01 LAB — LIPASE, BLOOD: Lipase: 24 U/L (ref 11–51)

## 2019-07-01 LAB — PROTIME-INR
INR: 1.3 — ABNORMAL HIGH (ref 0.8–1.2)
Prothrombin Time: 16.4 seconds — ABNORMAL HIGH (ref 11.4–15.2)

## 2019-07-01 MED ORDER — WARFARIN SODIUM 5 MG PO TABS
15.0000 mg | ORAL_TABLET | Freq: Once | ORAL | Status: AC
  Administered 2019-07-01: 15 mg via ORAL
  Filled 2019-07-01: qty 3

## 2019-07-01 MED ORDER — IOHEXOL 300 MG/ML  SOLN
125.0000 mL | Freq: Once | INTRAMUSCULAR | Status: AC | PRN
  Administered 2019-07-01: 125 mL via INTRAVENOUS

## 2019-07-01 MED ORDER — SODIUM CHLORIDE 0.9 % IV BOLUS
1000.0000 mL | Freq: Once | INTRAVENOUS | Status: AC
  Administered 2019-07-01: 1000 mL via INTRAVENOUS

## 2019-07-01 MED ORDER — MORPHINE SULFATE (PF) 4 MG/ML IV SOLN
4.0000 mg | Freq: Once | INTRAVENOUS | Status: AC
  Administered 2019-07-01: 4 mg via INTRAVENOUS
  Filled 2019-07-01: qty 1

## 2019-07-01 NOTE — ED Provider Notes (Signed)
[facility] EMERGENCY DEPARTMENT Provider Note   CSN: [id number] Arrival date & time: 07/01/19  2051     History Chief Complaint  Patient presents with   Abdominal Pain    Miguel Obrien is a 49 y.o. male with PMHx GERD and DVT on Coumadin who presents to the ED today complaining of gradual onset, constant, epigastric abdominal pain x 4 days. Pt also endorses looser stools.  Reports he recently finished antibiotics for diarrhea as well as a UTI.  Per chart review patient was seen at [facility] on 06/20/2019 for shortness of breath, cough, and sore throat.  He tested negative for flu, strep, Covid at that time.  His chest x-ray showed findings consistent with pneumonia on the left side and his urine looked infectious.  He was prescribed Levaquin 500 daily for 7 days.  He states that he finished the antibiotic prior to beginning to have belly pain and diarrhea.  No history of C. difficile in the past. Pt denies fever, chills, nausea, vomiting, blood in stool, urinary sx, or any other associated symptoms. Denies COVID 19 positive exposure. PSHx includes cholecystectomy.   The history is provided by the patient and medical records.       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 Authorizing Provider  FLUoxetine (PROZAC) 40 MG capsule TAKE ONE CAPSULE BY MOUTH EVERY MORNING   Yes Puthuvel, Iven Finn, MD  gabapentin (NEURONTIN) 300 MG capsule Take 300 mg by mouth 2 (two) times daily.   Yes [provider]  pantoprazole (PROTONIX) 40 MG tablet Take 40 mg by mouth daily.   Yes [provider]  risperiDONE (RISPERDAL) 2 MG tablet Take 1 tablet (2 mg total) by mouth at bedtime. 08/19/13  Yes Puthuvel, Iven Finn, MD  warfarin (COUMADIN) 5 MG tablet Take 5 mg by mouth daily. 7.5 mg Monday to Friday, and 10 mg Saturday and Sunday.   Yes [provider]    Allergies    Patient has no known allergies.  Review of Systems   Review of Systems  Constitutional: Negative for chills and fever.  Respiratory: Positive for shortness of breath (resolved). Negative for cough.   Cardiovascular: Negative for chest pain.  Gastrointestinal: Positive for abdominal pain and diarrhea. Negative for blood  in stool, nausea and vomiting.  All other systems reviewed and are negative.   Physical Exam Updated Vital Signs BP (!) 142/89    Pulse 89    Temp 98.8 F (37.1 C) (Oral)    Resp 20    Ht 5\' 10"  (1.778 m)    Wt (!) 149.7 kg    SpO2 98%    BMI 47.35 kg/m   Physical Exam Vitals and nursing note reviewed.  Constitutional:      Appearance: He is obese. He is not ill-appearing or diaphoretic.  HENT:     Head: Normocephalic and atraumatic.  Eyes:     Conjunctiva/sclera: Conjunctivae normal.  Cardiovascular:     Rate and Rhythm: Normal rate and regular rhythm.     Heart sounds: Normal heart sounds.  Pulmonary:      Effort: Pulmonary effort is normal.     Breath sounds: Normal breath sounds. No wheezing, rhonchi or rales.  Abdominal:     Palpations: Abdomen is soft.     Tenderness: There is abdominal tenderness in the epigastric area and periumbilical area. There is no right CVA tenderness, left CVA tenderness, guarding or rebound.  Musculoskeletal:     Cervical back: Neck supple.  Skin:    General: Skin is warm and dry.  Neurological:     Mental Status: He is alert.     ED Results / Procedures / Treatments   Labs (all labs ordered are listed, but only abnormal results are displayed) Labs Reviewed  COMPREHENSIVE METABOLIC PANEL - Abnormal; Notable for the following components:      Result Value   Calcium 8.7 (*)    All other components within normal limits  CBC WITH DIFFERENTIAL/PLATELET - Abnormal; Notable for the following components:   WBC 11.0 (*)    Monocytes Absolute 1.3 (*)    Abs Immature Granulocytes 0.26 (*)    All other components within normal limits  C DIFFICILE QUICK SCREEN W PCR REFLEX  LIPASE, BLOOD  URINALYSIS, ROUTINE W REFLEX MICROSCOPIC  PROTIME-INR    EKG None  Radiology CT Abdomen Pelvis W Contrast  Result Date: 07/01/2019 CLINICAL DATA:  Abdominal pain for 4 days EXAM: CT ABDOMEN AND PELVIS WITH CONTRAST TECHNIQUE: Multidetector CT imaging of the abdomen and pelvis was performed using the standard protocol following bolus administration of intravenous contrast. CONTRAST:  08/29/2019 OMNIPAQUE IOHEXOL 300 MG/ML  SOLN COMPARISON:  02/06/2018 FINDINGS: Lower chest: No acute pleural or parenchymal lung disease. Hepatobiliary: Diffuse fatty infiltration of the liver again noted. Gallbladder is surgically absent. Pancreas: Unremarkable. No pancreatic ductal dilatation or surrounding inflammatory changes. Spleen: Normal in size without focal abnormality. Adrenals/Urinary Tract: Punctate less than 2 mm nonobstructing renal calculi are seen, within the right kidney on image 42  and within the left kidney on image 46. No obstructive uropathy. Peripelvic cysts are again seen within the right kidney. The bladder is decompressed but otherwise unremarkable. The adrenals appear normal. Stomach/Bowel: No bowel obstruction or ileus. No inflammatory changes. Vascular/Lymphatic: Borderline enlarged retroperitoneal lymphadenopathy is seen, largest measuring 15 mm in the left periaortic region image 57. Multiple borderline enlarged left iliac adenopathy is seen, largest measuring 11 mm on image 81. The vessels are unremarkable. Reproductive: Prostate is unremarkable. Other: No abdominal wall hernia or abnormality. No abdominopelvic ascites. Musculoskeletal: No acute or destructive bony lesions. Reconstructed images demonstrate no additional findings. IMPRESSION: 1. Punctate bilateral nonobstructing renal calculi. 2. Nonspecific retroperitoneal lymphadenopathy. Minimal increase in size since prior study. 3. Diffuse fatty infiltration of  the liver. Electronically Signed   By: Randa Ngo M.D.   On: 07/01/2019 23:00    Procedures Procedures (including critical care time)  Medications Ordered in ED Medications  morphine 4 MG/ML injection 4 mg (4 mg Intravenous Given 07/01/19 2158)  sodium chloride 0.9 % bolus 1,000 mL ( Intravenous Rate/Dose Verify 07/01/19 2210)  iohexol (OMNIPAQUE) 300 MG/ML solution 125 mL (125 mLs Intravenous Contrast Given 07/01/19 2242)    ED Course  I have reviewed the triage vital signs and the nursing notes.  Pertinent labs & imaging results that were available during my care of the patient were reviewed by me and considered in my medical decision making (see chart for details).  49 year old male presents to the ED with abdominal pain to the periumbilical/epigastric area for the past 4 days with diarrhea.  He was recently seen in the ED on 1/28 for shortness of breath, cough, sore throat and tested negative for strep, flu, Covid.  Chest x-ray was concerning for  pneumonia and urine looked infected.  Patient was discharged with Levaquin.  He shortly afterwards began having abdominal pain and diarrhea.  On arrival to the ED patient is afebrile, nontachycardic and nontachypneic.  He has tenderness to the abdomen in the epigastrium and periumbilical area without peritoneal signs.  There is concern for C. difficile at this time given recent antibiotic use.  Will obtain stool study.  Will obtain CT scan.  Morphine and fluids given for comfort.  Patient denies nausea or vomiting.  Will hold off on Zofran.  If no acute findings on CT scan and PCR stool study may consider reswabbing for Covid although he did have a PCR test last week that was negative.   CBC with leukocytosis of 11,000. Hgb stable.  CMP without electrolyte abnormalities. Creatinine stable compared to baseline. LFTs within normal limits.  Lipase within normal limits.   CT scan with renal calculi however doubt this is causing pt's symptoms today.   Awaiting stool sample at this time.   11:07 PM At shift change case signed out to Dr. Randal Buba who will follow up on stool sample and INR.      MDM Rules/Calculators/A&P                       Final Clinical Impression(s) / ED Diagnoses Final diagnoses:  Generalized abdominal pain  Diarrhea, unspecified type    Rx / DC Orders ED Discharge Orders    None       Eustaquio Maize, PA-C 07/01/19 2309    Margette Fast, MD 07/02/19 [phone]

## 2019-07-01 NOTE — ED Notes (Signed)
Pt states he is still unable to provide a stool specimen

## 2019-07-01 NOTE — ED Triage Notes (Signed)
Abdominal pain x 4 days. Diarrhea. No vomiting. Body aches. No Covid exposure that he knows of. He had a negative Covid test last week.

## 2019-07-01 NOTE — Discharge Instructions (Signed)
Recheck INR this week, intact coumadin to 10 mg Thursday and Friday.  Bring stool sample to your doctor

## 2019-07-01 NOTE — ED Notes (Signed)
Transported to CT 

## 2021-01-13 ENCOUNTER — Observation Stay (HOSPITAL_COMMUNITY): Payer: Medicare Other

## 2021-01-13 ENCOUNTER — Observation Stay (HOSPITAL_COMMUNITY)
Admission: EM | Admit: 2021-01-13 | Discharge: 2021-01-14 | Disposition: A | Discharge status: Home / Self Care | Payer: Medicare Other | Attending: Internal Medicine | Admitting: Internal Medicine

## 2021-01-13 DIAGNOSIS — K2990 Gastroduodenitis, unspecified, without bleeding: Secondary | ICD-10-CM | POA: Diagnosis not present

## 2021-01-13 DIAGNOSIS — Z20822 Contact with and (suspected) exposure to covid-19: Secondary | ICD-10-CM | POA: Insufficient documentation

## 2021-01-13 DIAGNOSIS — Z86718 Personal history of other venous thrombosis and embolism: Secondary | ICD-10-CM | POA: Diagnosis not present

## 2021-01-13 DIAGNOSIS — R109 Unspecified abdominal pain: Secondary | ICD-10-CM | POA: Diagnosis present

## 2021-01-13 DIAGNOSIS — K2970 Gastritis, unspecified, without bleeding: Secondary | ICD-10-CM

## 2021-01-13 DIAGNOSIS — K921 Melena: Secondary | ICD-10-CM

## 2021-01-13 DIAGNOSIS — K922 Gastrointestinal hemorrhage, unspecified: Secondary | ICD-10-CM | POA: Insufficient documentation

## 2021-01-13 LAB — CBC
HCT: 44.7 % (ref 39.0–52.0)
Hemoglobin: 15 g/dL (ref 13.0–17.0)
MCH: 32.6 pg (ref 26.0–34.0)
MCHC: 33.6 g/dL (ref 30.0–36.0)
MCV: 97.2 fL (ref 80.0–100.0)
Platelets: 225 10*3/uL (ref 150–400)
RBC: 4.6 MIL/uL (ref 4.22–5.81)
RDW: 14.6 % (ref 11.5–15.5)
WBC: 9.3 10*3/uL (ref 4.0–10.5)
nRBC: 0 % (ref 0.0–0.2)

## 2021-01-13 LAB — TYPE AND SCREEN
ABO/RH(D): A POS
Antibody Screen: NEGATIVE

## 2021-01-13 LAB — COMPREHENSIVE METABOLIC PANEL
ALT: 31 U/L (ref 0–44)
AST: 30 U/L (ref 15–41)
Albumin: 3.7 g/dL (ref 3.5–5.0)
Alkaline Phosphatase: 39 U/L (ref 38–126)
Anion gap: 11 (ref 5–15)
BUN: 8 mg/dL (ref 6–20)
CO2: 21 mmol/L — ABNORMAL LOW (ref 22–32)
Calcium: 9.5 mg/dL (ref 8.9–10.3)
Chloride: 104 mmol/L (ref 98–111)
Creatinine, Ser: 1 mg/dL (ref 0.61–1.24)
GFR, Estimated: >60 mL/min (ref >60)
Glucose, Bld: 121 mg/dL — ABNORMAL HIGH (ref 70–99)
Potassium: 3.9 mmol/L (ref 3.5–5.1)
Sodium: 136 mmol/L (ref 135–145)
Total Bilirubin: 0.9 mg/dL (ref 0.3–1.2)
Total Protein: 6.7 g/dL (ref 6.5–8.1)

## 2021-01-13 LAB — RESP PANEL BY RT-PCR (FLU A&B, COVID) ARPGX2
Influenza A by PCR: NEGATIVE
Influenza B by PCR: NEGATIVE
SARS Coronavirus 2 by RT PCR: NEGATIVE

## 2021-01-13 LAB — POC OCCULT BLOOD, ED: Fecal Occult Bld: POSITIVE — AB

## 2021-01-13 LAB — ABO/RH: ABO/RH(D): A POS

## 2021-01-13 LAB — PROTIME-INR
INR: 1.9 — ABNORMAL HIGH (ref 0.8–1.2)
Prothrombin Time: 21.5 seconds — ABNORMAL HIGH (ref 11.4–15.2)

## 2021-01-13 MED ORDER — PANTOPRAZOLE SODIUM 40 MG IV SOLR
40.0000 mg | Freq: Once | INTRAVENOUS | Status: AC
  Administered 2021-01-13: 40 mg via INTRAVENOUS
  Filled 2021-01-13: qty 40

## 2021-01-13 MED ORDER — PANTOPRAZOLE SODIUM 40 MG IV SOLR: 40.0000 mg | Freq: Two times a day (BID) | INTRAVENOUS | Status: DC

## 2021-01-13 MED ORDER — ONDANSETRON HCL 4 MG/2ML IJ SOLN: 4.0000 mg | Freq: Four times a day (QID) | INTRAMUSCULAR | Status: DC | PRN

## 2021-01-13 MED ORDER — OXYCODONE-ACETAMINOPHEN 5-325 MG PO TABS
1.0000 | ORAL_TABLET | Freq: Two times a day (BID) | ORAL | Status: DC | PRN
  Administered 2021-01-13 – 2021-01-14 (×2): 1 via ORAL
  Filled 2021-01-13 (×2): qty 1

## 2021-01-13 MED ORDER — SODIUM CHLORIDE 0.9% FLUSH
3.0000 mL | Freq: Two times a day (BID) | INTRAVENOUS | Status: DC
  Administered 2021-01-13 – 2021-01-14 (×2): 3 mL via INTRAVENOUS

## 2021-01-13 MED ORDER — DICYCLOMINE HCL 20 MG PO TABS
20.0000 mg | ORAL_TABLET | Freq: Three times a day (TID) | ORAL | Status: DC
  Administered 2021-01-13 – 2021-01-14 (×3): 20 mg via ORAL
  Filled 2021-01-13 (×5): qty 1

## 2021-01-13 MED ORDER — PANTOPRAZOLE SODIUM 40 MG IV SOLR
40.0000 mg | Freq: Two times a day (BID) | INTRAVENOUS | Status: DC
  Administered 2021-01-13 – 2021-01-14 (×2): 40 mg via INTRAVENOUS
  Filled 2021-01-13 (×2): qty 40

## 2021-01-13 MED ORDER — ONDANSETRON HCL 4 MG PO TABS: 4.0000 mg | ORAL_TABLET | Freq: Four times a day (QID) | ORAL | Status: DC | PRN

## 2021-01-13 MED ORDER — ALUM & MAG HYDROXIDE-SIMETH 200-200-20 MG/5ML PO SUSP
30.0000 mL | Freq: Once | ORAL | Status: AC
  Administered 2021-01-13: 30 mL via ORAL
  Filled 2021-01-13: qty 30

## 2021-01-13 MED ORDER — TRAZODONE HCL 50 MG PO TABS
50.0000 mg | ORAL_TABLET | Freq: Every evening | ORAL | Status: DC | PRN
  Administered 2021-01-13: 50 mg via ORAL
  Filled 2021-01-13: qty 1

## 2021-01-13 MED ORDER — GABAPENTIN 300 MG PO CAPS
600.0000 mg | ORAL_CAPSULE | Freq: Two times a day (BID) | ORAL | Status: DC
  Administered 2021-01-13 – 2021-01-14 (×2): 600 mg via ORAL
  Filled 2021-01-13 (×2): qty 2

## 2021-01-13 MED ORDER — FAMOTIDINE IN NACL 20-0.9 MG/50ML-% IV SOLN
20.0000 mg | Freq: Once | INTRAVENOUS | Status: AC
  Administered 2021-01-13: 20 mg via INTRAVENOUS
  Filled 2021-01-13: qty 50

## 2021-01-13 MED ORDER — ENOXAPARIN SODIUM 40 MG/0.4ML IJ SOSY: 40.0000 mg | PREFILLED_SYRINGE | INTRAMUSCULAR | Status: DC

## 2021-01-13 MED ORDER — SUCRALFATE 1 GM/10ML PO SUSP
1.0000 g | Freq: Once | ORAL | Status: AC
  Administered 2021-01-13: 1 g via ORAL
  Filled 2021-01-13 (×2): qty 10

## 2021-01-13 MED ORDER — ATORVASTATIN CALCIUM 10 MG PO TABS
10.0000 mg | ORAL_TABLET | Freq: Every day | ORAL | Status: DC
  Administered 2021-01-13 – 2021-01-14 (×2): 10 mg via ORAL
  Filled 2021-01-13 (×2): qty 1

## 2021-01-13 MED ORDER — IOHEXOL 350 MG/ML SOLN
100.0000 mL | Freq: Once | INTRAVENOUS | Status: AC | PRN
  Administered 2021-01-13: 100 mL via INTRAVENOUS

## 2021-01-13 MED ORDER — RISPERIDONE 2 MG PO TABS
2.0000 mg | ORAL_TABLET | Freq: Every day | ORAL | Status: DC
  Administered 2021-01-13: 2 mg via ORAL
  Filled 2021-01-13 (×3): qty 1

## 2021-01-13 MED ORDER — FLUOXETINE HCL 20 MG PO CAPS
40.0000 mg | ORAL_CAPSULE | Freq: Every morning | ORAL | Status: DC
  Administered 2021-01-13 – 2021-01-14 (×2): 40 mg via ORAL
  Filled 2021-01-13 (×2): qty 2

## 2021-01-13 MED ORDER — PRAMIPEXOLE DIHYDROCHLORIDE 1.5 MG PO TABS: 3.0000 mg | ORAL_TABLET | Freq: Every day | ORAL | Status: DC

## 2021-01-13 MED ORDER — LIDOCAINE VISCOUS HCL 2 % MT SOLN
15.0000 mL | Freq: Once | OROMUCOSAL | Status: AC
  Administered 2021-01-13: 15 mL via ORAL
  Filled 2021-01-13: qty 15

## 2021-01-13 MED ORDER — ACETAMINOPHEN 325 MG PO TABS: 650.0000 mg | ORAL_TABLET | Freq: Four times a day (QID) | ORAL | Status: DC | PRN

## 2021-01-13 MED ORDER — ACETAMINOPHEN 650 MG RE SUPP: 650.0000 mg | Freq: Four times a day (QID) | RECTAL | Status: DC | PRN

## 2021-01-13 MED ORDER — PANTOPRAZOLE INFUSION (NEW) - SIMPLE MED
8.0000 mg/h | INTRAVENOUS | Status: DC
  Administered 2021-01-13: 8 mg/h via INTRAVENOUS
  Filled 2021-01-13: qty 80

## 2021-01-13 NOTE — ED Provider Notes (Signed)
[facility] EMERGENCY DEPARTMENT Provider Note   CSN: [id number] Arrival date & time: 01/13/21  2440     History Chief Complaint  Patient presents with   GI Problem    Miguel Obrien is a 50 y.o. male.  Past medical history of GERD, morbid obesity, DVT on Coumadin, OSA, venous stasis ulcer of left lower extremity.  He presents with a chief complaint of black stools over the past few days.   He has been having epigastric pain with associated nausea/vomiting since May. He is usually most nauseous in the mornings. He rarely vomits during the day. He states that the pain is constant, but it is worse with eating. He denies hematemesis. He also has been having associated lightheadedness, dizziness, decreased appetite, and weight lost over the past several months. He has been seen by several providers. He received care at [facility] where they did a colonoscopy/endoscopy on August 12th. Only findings were a hiatal hernia per patient (I cannot see in his chart). He was seen in the [facility] ED on the 13th. They did initial lab work and CT scan that was unremarkable. He was sent home with an additional PPI and Pepcid.  He is concerned that he has a bleeding ulcer.   GI Problem Associated symptoms include abdominal pain. Pertinent negatives include no chest pain, no headaches and no shortness of breath.      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 Authorizing Provider  meclizine (ANTIVERT) 25 MG tablet Take 25 mg by mouth 3 (three) times daily as needed for dizziness. 01/07/21  Yes [provider]  pantoprazole (PROTONIX) 40 MG tablet Take 40 mg by mouth daily. 01/19/12 01/31/21 Yes [provider]  triamcinolone cream (KENALOG) 0.1 % Apply 1 application topically daily as needed. 01/03/21  Yes [provider]  atorvastatin (LIPITOR) 10 MG tablet Take 10 mg by mouth daily. 12/12/20   [provider]  famotidine (PEPCID) 20 MG tablet Take 20 mg by mouth 2 (two) times daily. 12/10/20   [provider]  FLUoxetine (PROZAC) 40 MG capsule TAKE ONE CAPSULE BY MOUTH EVERY MORNING Patient taking differently: Take 40 mg by mouth daily.    Puthuvel, Iven Finn, MD  gabapentin (NEURONTIN) 600 MG tablet Take 600-1,200 mg by  mouth in the morning and at bedtime. 600mg  in the morning and 1200mg  at bedtime 12/30/20   [provider]  meloxicam (MOBIC) 15 MG tablet Take 15 mg by mouth daily. 12/14/20   [provider]  pramipexole (MIRAPEX) 1 MG tablet Take 3 mg by mouth at bedtime. 09/07/20   [provider]  PROLATE 5-300 MG tablet Take 1 tablet by mouth 2 (two) times daily. 12/22/20   [provider]  risperiDONE (RISPERDAL) 2 MG tablet Take 1 tablet (2 mg total) by mouth at bedtime. 08/19/13   Puthuvel, 02/21/21, MD  traZODone (DESYREL) 100 MG tablet Take 50-100 mg by mouth at bedtime as  needed for sleep. 11/17/20   [provider]  warfarin (COUMADIN) 5 MG tablet Take 5 mg by mouth daily. 7.5 mg Monday to Friday, and 10 mg Saturday and Sunday.    [provider]    Allergies    Gentamycin [gentamicin] and Penicillins  Review of Systems   Review of Systems  Constitutional:  Positive for fatigue and unexpected weight change. Negative for chills and fever.  Eyes:  Negative for visual disturbance.  Respiratory:  Negative for cough, chest tightness and shortness of breath.   Cardiovascular:  Negative for chest pain, palpitations and leg swelling.  Gastrointestinal:  Positive for abdominal pain, blood in stool, diarrhea, nausea and vomiting. Negative for abdominal distention and constipation.  Genitourinary:  Negative for difficulty urinating, dysuria, flank pain and hematuria.  Skin:  Positive for wound.  Neurological:  Positive for dizziness and light-headedness. Negative for syncope, weakness, numbness and headaches.   Physical Exam Updated Vital Signs BP (!) 142/82   Pulse 78   Temp 98.1 F (36.7 C) (Oral)   Resp 16   Ht 5\' 10"  (1.778 m)   Wt (!) 154.2 kg   SpO2 96%   BMI 48.78 kg/m   Physical Exam Vitals and nursing note reviewed. Exam conducted with a chaperone present.  Constitutional:      General: He is not in acute distress.    Appearance: Normal appearance. He is obese. He is not ill-appearing, toxic-appearing or diaphoretic.  HENT:     Head: Normocephalic and atraumatic.  Eyes:     General: No scleral icterus.       Right eye: No discharge.        Left eye: No discharge.     Conjunctiva/sclera: Conjunctivae normal.  Cardiovascular:     Rate and Rhythm: Normal rate and regular rhythm.     Pulses: Normal pulses.     Heart sounds: Normal heart sounds, S1 normal and S2 normal. No murmur heard.   No friction rub. No gallop.  Pulmonary:     Effort: Pulmonary effort is normal. No respiratory distress.     Breath sounds: Normal breath  sounds. No wheezing, rhonchi or rales.  Abdominal:     General: Abdomen is flat. Bowel sounds are normal. There is no distension.     Palpations: Abdomen is soft. There is no mass or pulsatile mass.     Tenderness: There is abdominal tenderness in the epigastric area. There is no guarding or rebound.  Genitourinary:    Rectum: Normal. Guaiac result positive.     Comments: Small amt of stool present in rectal vault. It is dark colored Musculoskeletal:     Right lower leg: No edema.     Left lower leg: No edema.  Skin:    General: Skin is warm and dry.  Coloration: Skin is not jaundiced.     Findings: No bruising, erythema, lesion or rash.     Comments: Venous stasis ulcer to left leg. It is bandaged. Dressing is clean dry intact. Patient is seen by wound care outpatient.  Neurological:     General: No focal deficit present.     Mental Status: He is alert and oriented to person, place, and time.  Psychiatric:        Mood and Affect: Mood normal.        Behavior: Behavior normal.    ED Results / Procedures / Treatments   Labs (all labs ordered are listed, but only abnormal results are displayed) Labs Reviewed  COMPREHENSIVE METABOLIC PANEL - Abnormal; Notable for the following components:      Result Value   CO2 21 (*)    Glucose, Bld 121 (*)    All other components within normal limits  PROTIME-INR - Abnormal; Notable for the following components:   Prothrombin Time 21.5 (*)    INR 1.9 (*)    All other components within normal limits  POC OCCULT BLOOD, ED - Abnormal; Notable for the following components:   Fecal Occult Bld POSITIVE (*)    All other components within normal limits  CBC  TYPE AND SCREEN  ABO/RH    EKG None  Radiology No results found.  Procedures Procedures   Medications Ordered in ED Medications  pantoprozole (PROTONIX) 80 mg /NS 100 mL infusion (8 mg/hr Intravenous New Bag/Given 01/13/21 1210)  pantoprazole (PROTONIX) injection 40 mg (has no  administration in time range)  pantoprazole (PROTONIX) injection 40 mg (40 mg Intravenous Given 01/13/21 1016)  sucralfate (CARAFATE) 1 GM/10ML suspension 1 g (1 g Oral Given 01/13/21 1016)  famotidine (PEPCID) IVPB 20 mg premix (0 mg Intravenous Stopped 01/13/21 1055)  alum & mag hydroxide-simeth (MAALOX/MYLANTA) 200-200-20 MG/5ML suspension 30 mL (30 mLs Oral Given 01/13/21 1016)    And  lidocaine (XYLOCAINE) 2 % viscous mouth solution 15 mL (15 mLs Oral Given 01/13/21 1016)  pantoprazole (PROTONIX) injection 40 mg (40 mg Intravenous Given 01/13/21 1058)    ED Course  I have reviewed the triage vital signs and the nursing notes.  Pertinent labs & imaging results that were available during my care of the patient were reviewed by me and considered in my medical decision making (see chart for details).  Clinical Course as of 01/13/21 1227  Thu Jan 13, 2021  0929 Orthostatic positive when standing [GL]  1015 Treated with GI cocktail, ppi, carafate, pepsid [GL]  1017 Fecal Occult positive. Consult placed for GI.   [GL]  1033 Case discussed with Dr. Orvan Falconer and [facility]ian[facility] Bushy GI, they will see the patient in consult. [KF]  1104 Case discussed with internal medicine teaching service who will see and admit the patient [KF]    Clinical Course User Index [GL] Janaisha Tolsma, Finis Bud, PA-C [KF] Legrand Rams   MDM Rules/Calculators/A&P                         Patient presents to the ED with complaints of melena for three days. Nontoxic, vitals stable.  He is orthostatic positive.  He has epigastric tenderness on exam.  His history and exam is concerning for a GI ulcer. Also considering gastritis. He is on warfarin.  Hemoccult positive.  GI cocktail, Protonix, Carafate, Pepcid given in the ED to treat symptoms.  Basic labs obtained. No imaging obtained due to recent  negative CT scan.  His hemoglobin is stable.  INR 1.9 is subtherapeutic.  Other labs are otherwise unremarkable.  Due to patient's  history of being on blood thinners and new melena with positive Hemoccult, we consulted GI team.  We will treat for an upper GI bleed.  Patient is given an additional 40 mg bolus of Protonix and started on a continuous infusion. The plan is to admit patient to hospitalist service.  Spoke with Internal Medicine Teaching Service.  who agrees to assume care of patient and bring her into the hospital for further evaluation and management. Discussed with patient the need for hospitalization and further care. Patient is understanding and willing to be admitted to hospital.   Portions of this note were generated with Dragon dictation software. Dictation errors may occur despite best attempts at proofreading.   Final Clinical Impression(s) / ED Diagnoses Final diagnoses:  Upper GI bleed    Rx / DC Orders ED Discharge Orders     None        Claudie LeachLoeffler, Taris Galindo C, PA-C 01/13/21 1227    Pricilla LovelessGoldston, Scott, MD 01/13/21 [phone]

## 2021-01-13 NOTE — Consult Note (Addendum)
WOC Nurse Consult Note: Patient receiving care in [facility] ED006 Followed by Wound [facility] in [facility] Dr. Nelda Severe. Left leg was wrapped in a 4 layer wrap.  Reason for Consult: LLE wound Wound type: LLE medial malleolus venous stasis ulcer. Pressure Injury POA: NA Measurement: 2.5 cm x 2 cm x 0.3 cm Wound bed: 100% yellow Drainage (amount, consistency, odor) dark brown purulent drainage on silver dressing removed.  Periwound: Erythmatous Dressing procedure/placement/frequency: Clean the LLE wound with NS, pat dry then apply a small piece of Aquacel Advantage [employer] [place] # [id number]) over the wound. Wrap the LLE with Kerlix and Ace Wrap from just below the toes to just below the knee. Keep the leg wrapped snuggly but not too tight. Change Aquacel daily.   Monitor the wound area(s) for worsening of condition such as: Signs/symptoms of infection, increase in size, development of or worsening of odor, development of pain, or increased pain at the affected locations.   Notify the medical team if any of these develop.  Thank you for the consult. WOC nurse will not follow at this time.   Please re-consult the WOC team if needed.  Renaldo Reel Katrinka Blazing, MSN, RN, CMSRN, Angus Seller, [facility] Wound Treatment Associate Pager [phone]

## 2021-01-13 NOTE — Consult Note (Addendum)
Referring Provider:  EDP Primary Care Physician:  Salli Quarry, PA-C Primary Gastroenterologist:  Dr. Eleanora Neighbor through [facility]  Reason for Consultation:  Melena and heme positive stools  HPI: Miguel Obrien is a 50 y.o. male who is on chronic Coumadin due to remote history of bilateral lower extremity DVTs, GERD, morbid obesity, obstructive sleep apnea, peripheral neuropathy, venous stasis ulcer of the lower extremity.  He is a patient of GI at [facility].  He just had an EGD and colonoscopy a couple weeks ago as listed below.  He presented here with ongoing complaints of epigastric abdominal pain and black stools.  His wife tells me he has been having this abdominal pain since at least May.  He feels that the pain has worsened since his EGD.  He was found to be heme positive in the ED, but his hemoglobin is completely normal.  He does admit that he has been using Pepto-Bismol twice a day regularly to try to help with his abdominal pain.  He admits to some nausea and a couple of episodes of clear vomiting, no sign of blood.  Not taking NSAIDs at home.  He takes pantoprazole 40 mg daily at home.  They have him on pantoprazole 40 mg twice daily IV here.  CT of the abdomen and pelvis without contrast at [facility] on 8/13 showed the following:  IMPRESSION:  1. No acute findings within the abdomen or pelvis. No  hydronephrosis. No ureteral stone. No bowel obstruction or evidence  of bowel wall inflammation.  2. Bilateral nephrolithiasis.  3. Fatty infiltration of the liver.    EGD performed on 12/31/2020 through [facility] showed irregular Z-line that was biopsied, small hiatal hernia, gastritis that was biopsied.  Duodenum was normal but was biopsied.  Colonoscopy 12/31/2020 also through [facility] revealed a 10 mm polyp that was found removed, but not retrieved.  Right and left colon were biopsied.  There were small internal  hemorrhoids.  I do not have pathology.  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 Authorizing Provider  FLUoxetine (PROZAC) 40 MG capsule TAKE ONE CAPSULE BY MOUTH EVERY MORNING    Puthuvel, Iven Finn, MD  gabapentin (NEURONTIN) 300 MG capsule Take 300 mg by mouth 2 (two) times daily.    [provider]  pantoprazole (PROTONIX) 40 MG tablet Take 40 mg by mouth daily.    [provider]  risperiDONE (RISPERDAL) 2 MG tablet Take 1 tablet (2 mg total) by mouth at bedtime. 08/19/13   Puthuvel, Iven Finn, MD  warfarin (COUMADIN) 5 MG tablet Take 5 mg by mouth daily. 7.5 mg Monday to Friday, and 10 mg Saturday and Sunday.    [provider]    Current Facility-Administered Medications  Medication Dose Route Frequency Provider Last Rate Last Admin   [START ON 01/16/2021] pantoprazole (PROTONIX) injection 40 mg  40 mg Intravenous Q12H  Loeffler, Finis Bud, PA-C       pantoprozole (PROTONIX) 80 mg /NS 100 mL infusion  8 mg/hr Intravenous Continuous Loeffler, Finis Bud, PA-C       Current Outpatient Medications  Medication Sig Dispense Refill   FLUoxetine (PROZAC) 40 MG capsule TAKE ONE CAPSULE BY MOUTH EVERY MORNING 30 capsule 3   gabapentin (NEURONTIN) 300 MG capsule Take 300 mg by mouth 2 (two) times daily.     pantoprazole (PROTONIX) 40 MG tablet Take 40 mg by mouth daily.     risperiDONE (RISPERDAL) 2 MG tablet Take 1 tablet (2 mg total) by mouth at bedtime. 30 tablet 3    warfarin (COUMADIN) 5 MG tablet Take 5 mg by mouth daily. 7.5 mg Monday to Friday, and 10 mg Saturday and Sunday.      Allergies as of 01/13/2021 - Review Complete 01/13/2021  Allergen Reaction Noted   Gentamycin [gentamicin]  01/13/2021   Penicillins  01/13/2021    Family History  Problem Relation Age of Onset   Diabetes Mellitus II Mother    Diabetes Mellitus II Father    Deep vein thrombosis Brother     Social History   Socioeconomic History   Marital status: Married    Spouse name: Not on file   Number of children: Not on file   Years of education: Not on file   Highest education level: Not on file  Occupational History   Not on file  Tobacco Use   Smoking status: Never   Smokeless tobacco: Never  Substance and Sexual Activity   Alcohol use: No   Drug use: No   Sexual activity: Yes    Partners: Female    Birth control/protection: None  Other Topics Concern   Not on file  Social History Narrative   Not on file   Social Determinants of Health   Financial Resource Strain: Not on file  Food Insecurity: Not on file  Transportation Needs: Not on file  Physical Activity: Not on file  Stress: Not on file  Social Connections: Not on file  Intimate Partner Violence: Not on file    Review of Systems: ROS is O/W negative except as mentioned in HPI.  Physical Exam: Vital signs in last 24 hours: Temp:  [98.1 F (36.7 C)] 98.1 F (36.7 C) (08/25 0705) Pulse Rate:  [64-74] 68 (08/25 1022) Resp:  [18-20] 20 (08/25 1022) BP: (121-147)/(65-80) 124/70 (08/25 1022) SpO2:  [94 %-100 %] 99 % (08/25 1022) Weight:  [154.2 kg] 154.2 kg (08/25 1024)   General:  Alert, Well-developed, well-nourished, pleasant and cooperative in NAD Head:  Normocephalic and atraumatic. Eyes:  Sclera clear, no icterus.  Conjunctiva pink. Ears:  Normal auditory acuity. Mouth:  No deformity or lesions.   Lungs:  Clear throughout to auscultation.  No wheezes, crackles, or rhonchi.  Heart:   Regular rate and rhythm; no murmurs, clicks, rubs, or gallops. Abdomen:  Soft, obese, non-distended.  BS present.  Mild diffuse TTP>in the epigastrium. Rectal:  Deferred.  Was reported heme positive with exam in the ED.  Msk:  Symmetrical without gross deformities. Pulses:  Normal pulses noted. Extremities:  LLE with dressings. Neurologic:  Alert and oriented x 4;  grossly normal neurologically. Skin:  Intact without significant lesions or rashes. Psych:  Alert and cooperative. Normal mood and affect.  Lab Results: Recent Labs    01/13/21 0723  WBC 9.3  HGB 15.0  HCT 44.7  PLT 225   BMET Recent Labs  01/13/21 0723  NA 136  K 3.9  CL 104  CO2 21*  GLUCOSE 121*  BUN 8  CREATININE 1.00  CALCIUM 9.5   LFT Recent Labs    01/13/21 0723  PROT 6.7  ALBUMIN 3.7  AST 30  ALT 31  ALKPHOS 39  BILITOT 0.9   PT/INR Recent Labs    01/13/21 0723  LABPROT 21.5*  INR 1.9*    IMPRESSION:  *50 year old male with complaints of melena, heme positive on exam.  Hemoglobin is normal.  He just had an EGD on 8/12 with only small hernia and gastritis.  He is on Protonix at home.  He has complaints of chronic epigastric abdominal pain.  Colonoscopy also on 8/12 with a 10 mm polyp that was removed but not retrieved and internal hemorrhoids.  He admits that he has been using Pepto-Bismol regularly so that could certainly account for his black stools.  His abdominal pain has been an ongoing/chronic issue since at least May.   *Chronic anticoagulation with Coumadin for history of DVTs: INR only 1.9.  PLAN: -Agree with pantoprazole 40 mg IV BID for now. -Not sure that he needs repeat EGD here unless he were to have a drop in Hgb.  I will be curious to see if the stool color returns to normal over the next couple of days with discontinuation of the Pepto-Bismol.  **Will follow-up with his GI through [facility] upon discharge.   Princella Pellegrini. Kharizma Lesnick  01/13/2021, 11:08 AM

## 2021-01-13 NOTE — ED Triage Notes (Signed)
Patient here with complaint of black stools that started two days ago, states he thinks he has a bleeding ulcer. Patient also states he had an endoscopy on Friday and was told he had a hiatal hernia. Patient takes warfarin.

## 2021-01-13 NOTE — H&P (Signed)
Date: 01/13/2021               Patient Name:  Miguel Obrien MRN: [id number]  DOB: 1970-09-08 Age / Sex: 50 y.o., male   PCP: Salli Quarry, PA-C         Medical Service: Internal Medicine Teaching Service         Attending Physician: Dr. Inez Catalina, MD    First Contact: Elza Rafter, DO Pager: RA [phone]  Second Contact: Thalia Bloodgood, DO Pager: Theresa Duty [phone]       After Hours (After 5p/  First Contact Pager: [phone]  weekends / holidays): Second Contact Pager: [phone]   SUBJECTIVE  Chief Complaint: abdominal pain  History of Present Illness: Miguel Obrien is a 50 y.o. 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 with abdominal pain.  History obtained by patient and wife at bedside. Patient reports that he has been having epigastric abdominal pain since May 2022. He endorses constant abdominal pain that is worse with eating despite protonix and pepcid. He reports associated nausea that is worse in the mornings and reports dry heaving every morning; however, no vomiting. He was referred to gastroenterology and underwent endoscopy and colonoscopy with [facility] on August 12th. He reports that this was only significant for a hiatal hernia and a small polyp. He reports that symptoms have persisted since then and had a visit to the ED on 8/13 and was discharged on addition of H2 blocker with his PPI twice daily. He reports that his pain has continued and over the past three days, he is having dark stools with at least 2-3 bowel movements daily. He endorses lightheadedness/dizziness on standing. He denies any headaches, vision changes, chest pain, shortness of breath or focal deficits.   Medications: No current facility-administered medications on file prior to encounter.   Current Outpatient Medications on File Prior to Encounter  Medication Sig Dispense Refill    meclizine (ANTIVERT) 25 MG tablet Take 25 mg by mouth 3 (three) times daily as needed for dizziness.     pantoprazole (PROTONIX) 40 MG tablet Take 40 mg by mouth daily.     triamcinolone cream (KENALOG) 0.1 % Apply 1 application topically daily as needed.     atorvastatin (LIPITOR) 10 MG tablet Take 10 mg by mouth daily.     famotidine (PEPCID) 20 MG tablet Take 20 mg by mouth 2 (two) times daily.     FLUoxetine (PROZAC) 40 MG capsule TAKE ONE CAPSULE BY MOUTH EVERY MORNING (Patient taking differently: Take 40 mg by mouth daily.) 30 capsule 3   gabapentin (NEURONTIN) 600 MG tablet Take 600-1,200 mg by mouth in the morning and at bedtime. 600mg  in the morning and 1200mg  at bedtime     meloxicam (MOBIC) 15 MG tablet Take 15 mg by mouth daily.     pramipexole (MIRAPEX) 1 MG tablet Take 3 mg by mouth at bedtime.     PROLATE 5-300 MG tablet Take 1 tablet by mouth 2 (two) times daily.     risperiDONE (RISPERDAL) 2 MG tablet Take 1 tablet (2 mg total) by mouth at bedtime. 30 tablet 3   traZODone (DESYREL) 100 MG tablet Take 50-100 mg by mouth at bedtime as needed for sleep.     warfarin (COUMADIN) 5 MG tablet Take 5 mg by mouth daily. 7.5 mg Monday to Friday, and 10 mg Saturday and  Sunday.      Past Medical History:  Past Medical History:  Diagnosis Date   DVT, bilateral lower limbs (HCC) About 10 years   GERD (gastroesophageal reflux disease)    Diagnosed 4-5 years   Insomnia    Morbid obesity with BMI of 45.0-49.9, adult (HCC)    OSA (obstructive sleep apnea)    Peripheral neuropathy About 2 years   Restless legs syndrome (RLS)    Venous stasis ulcer of lower extremity (HCC)     Social:  Patient lives at home with his wife. He is currently disabled. He denies any history of tobacco use, alcohol use or illicit drug use.   Family History: Family History  Problem Relation Age of Onset   Diabetes Mellitus II Mother    Diabetes Mellitus II Father    Deep vein thrombosis Brother      Allergies: Allergies as of 01/13/2021 - Review Complete 01/13/2021  Allergen Reaction Noted   Gentamycin [gentamicin]  01/13/2021   Penicillins  01/13/2021    Review of Systems: A complete ROS was negative except as per HPI.   OBJECTIVE:  Physical Exam: Blood pressure (!) 142/82, pulse 78, temperature 98.1 F (36.7 C), temperature source Oral, resp. rate 16, height 5\' 10"  (1.778 m), weight (!) 154.2 kg, SpO2 96 %. Physical Exam  Constitutional: Middle aged male, Appears well-developed and well-nourished. No distress.  HENT: Normocephalic and atraumatic, EOMI, conjunctiva normal, moist mucous membranes Cardiovascular: Normal rate, regular rhythm, S1 and S2 present, no murmurs, rubs, gallops.  Distal pulses intact Respiratory: No respiratory distress, Effort is normal.  Lungs are clear to auscultation bilaterally. GI: Mildly distended, soft, diffuse tenderness to palpation most significant in epigastric and LUQ region, normal bowel sounds Musculoskeletal: Normal bulk and tone.  No peripheral edema noted. Neurological: Is alert and oriented x4, no apparent focal deficits noted. Skin: Warm and dry.  No rash, erythema, lesions noted. Psychiatric: Normal mood and affect.   Pertinent Labs: CBC    Component Value Date/Time   WBC 9.3 01/13/2021 0723   RBC 4.60 01/13/2021 0723   HGB 15.0 01/13/2021 0723   HCT 44.7 01/13/2021 0723   PLT 225 01/13/2021 0723   MCV 97.2 01/13/2021 0723   MCH 32.6 01/13/2021 0723   MCHC 33.6 01/13/2021 0723   RDW 14.6 01/13/2021 0723   LYMPHSABS 3.1 07/01/2019 2156   MONOABS 1.3 (H) 07/01/2019 2156   EOSABS 0.3 07/01/2019 2156   BASOSABS 0.1 07/01/2019 2156     CMP     Component Value Date/Time   NA 136 01/13/2021 0723   K 3.9 01/13/2021 0723   CL 104 01/13/2021 0723   CO2 21 (L) 01/13/2021 0723   GLUCOSE 121 (H) 01/13/2021 0723   BUN 8 01/13/2021 0723   CREATININE 1.00 01/13/2021 0723   CALCIUM 9.5 01/13/2021 0723   PROT 6.7 01/13/2021  0723   ALBUMIN 3.7 01/13/2021 0723   AST 30 01/13/2021 0723   ALT 31 01/13/2021 0723   ALKPHOS 39 01/13/2021 0723   BILITOT 0.9 01/13/2021 0723   GFRNONAA >60 01/13/2021 0723   GFRAA >60 07/01/2019 2156    Pertinent Imaging: No results found.  EKG: personally reviewed my interpretation is normal EKG, normal sinus rhythm  ASSESSMENT & PLAN:  Assessment: Active Problems:   GI bleed   [address] is a 50 y.o. 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 Work up thus far, including endoscopy and colonoscopy on 8/12 notable for a small hiatal hernia and polyp that was removed, biopsies pending. He has been on PPI and H2 blocker therapy; however, continues to have persistent symptoms. Additionally, patient is having melena over the past several days with 2-3 episodes of diarrhea daily. He presented to the ED on 8/13 and CT Abdomen/Pelvis wo contrast at the time without acute findings noted. Noted to have FOBT stools in the ED. Hemoglobin is stable. - GI consulted, appreciate recommendations - IV protonix 40mg  bid  - CTA Abdomen/Pelvis  - Trending CBC   #Recurrent bilateral lower extremity DVT  Patient is on warfarin therapy for this. INR is subtherapeutic at 1.9 on admission. Will hold off on resumption of warfarin in setting of acute GI bleed.  - Trend PT/INR  - Will resume warfarin once acute bleed resolves   #Chronic venous stasis ulcer  Patient has a chronic venous stasis ulcer of his left ankle for which he follows at wound care. He was last evaluated two days prior for this and recommended for continued wound dressing and follow up in 1 week. - Continue wound dressing - Consult to WOC  #Restless  leg syndrome - Resume fluoxetine 40mg  daily - Resume pramipexole 3mg  qHS  #Mood disorder - Resume risperidone 2mg  daily - Resume trazodone 50mg  qHS  #Chronic pain  - Resume Lynox 5-300 twice daily prn   Best Practice: Diet: Clear liquid diet IVF: Fluids: None, Rate: None VTE: None Code: Full Status: Observation with expected length of stay less than 2 midnights. Anticipated Discharge Location: Home Barriers to Discharge: Medical stability  Signature: , MD Internal Medicine Resident, PGY-3 Internal Medicine Residency  Pager: [phone] 12:40 PM, 01/13/2021   Please contact the on call pager after 5 pm and on weekends at [phone].

## 2021-01-13 NOTE — ED Notes (Signed)
Resting quietly visitor at bedside.  No request at present

## 2021-01-14 ENCOUNTER — Other Ambulatory Visit: Payer: Self-pay | Admitting: Internal Medicine

## 2021-01-14 DIAGNOSIS — K2990 Gastroduodenitis, unspecified, without bleeding: Secondary | ICD-10-CM

## 2021-01-14 DIAGNOSIS — K2970 Gastritis, unspecified, without bleeding: Secondary | ICD-10-CM | POA: Diagnosis not present

## 2021-01-14 LAB — PROTIME-INR
INR: 1.7 — ABNORMAL HIGH (ref 0.8–1.2)
Prothrombin Time: 20.2 seconds — ABNORMAL HIGH (ref 11.4–15.2)

## 2021-01-14 LAB — BASIC METABOLIC PANEL
Anion gap: 10 (ref 5–15)
BUN: 8 mg/dL (ref 6–20)
CO2: 20 mmol/L — ABNORMAL LOW (ref 22–32)
Calcium: 8.7 mg/dL — ABNORMAL LOW (ref 8.9–10.3)
Chloride: 102 mmol/L (ref 98–111)
Creatinine, Ser: 1.05 mg/dL (ref 0.61–1.24)
GFR, Estimated: >60 mL/min (ref >60)
Glucose, Bld: 123 mg/dL — ABNORMAL HIGH (ref 70–99)
Potassium: 3.3 mmol/L — ABNORMAL LOW (ref 3.5–5.1)
Sodium: 132 mmol/L — ABNORMAL LOW (ref 135–145)

## 2021-01-14 LAB — CBC
HCT: 41.2 % (ref 39.0–52.0)
Hemoglobin: 13.9 g/dL (ref 13.0–17.0)
MCH: 32.4 pg (ref 26.0–34.0)
MCHC: 33.7 g/dL (ref 30.0–36.0)
MCV: 96 fL (ref 80.0–100.0)
Platelets: 188 10*3/uL (ref 150–400)
RBC: 4.29 MIL/uL (ref 4.22–5.81)
RDW: 14.4 % (ref 11.5–15.5)
WBC: 9.2 10*3/uL (ref 4.0–10.5)
nRBC: 0 % (ref 0.0–0.2)

## 2021-01-14 LAB — APTT: aPTT: 38 seconds — ABNORMAL HIGH (ref 24–36)

## 2021-01-14 LAB — HEMOGLOBIN A1C
Hgb A1c MFr Bld: 5.8 % — ABNORMAL HIGH (ref 4.8–5.6)
Mean Plasma Glucose: 119.76 mg/dL

## 2021-01-14 LAB — HIV ANTIBODY (ROUTINE TESTING W REFLEX): HIV Screen 4th Generation wRfx: NONREACTIVE

## 2021-01-14 MED ORDER — POTASSIUM CHLORIDE CRYS ER 20 MEQ PO TBCR
40.0000 meq | EXTENDED_RELEASE_TABLET | Freq: Two times a day (BID) | ORAL | Status: DC
  Administered 2021-01-14: 40 meq via ORAL
  Filled 2021-01-14: qty 2

## 2021-01-14 MED ORDER — SUCRALFATE 1 GM/10ML PO SUSP
1.0000 g | Freq: Three times a day (TID) | ORAL | Status: DC
  Administered 2021-01-14: 1 g via ORAL
  Filled 2021-01-14: qty 10

## 2021-01-14 MED ORDER — PANTOPRAZOLE SODIUM 40 MG PO TBEC: 40.0000 mg | DELAYED_RELEASE_TABLET | Freq: Two times a day (BID) | ORAL | 0 refills | Status: AC

## 2021-01-14 MED ORDER — SUCRALFATE 1 G PO TABS: 1.0000 g | ORAL_TABLET | Freq: Three times a day (TID) | ORAL | 0 refills | Status: AC

## 2021-01-14 MED ORDER — DICYCLOMINE HCL 20 MG PO TABS: 20.0000 mg | ORAL_TABLET | Freq: Three times a day (TID) | ORAL | 0 refills | Status: AC

## 2021-01-14 NOTE — Hospital Course (Addendum)
Miguel Obrien is a 50 y.o. 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  #Gastritis #Abdominal pain Patient presented with several months of epigastric abdominal pain with nausea. Work up thus far, including endoscopy and colonoscopy on 8/12 notable for a small hiatal hernia and polyp that was removed, biopsies pending. He has been on PPI and H2 blocker therapy; however, continues to have persistent symptoms. Additionally, patient is having melena over the past several days with 2-3 episodes of diarrhea daily. Melena is likely secondary to pepto bismol use. He presented to the ED on 8/13 and CT Abdomen/Pelvis wo contrast at the time without acute findings noted. Noted to have FOBT stools in the ED likely secondary to recent EGD/colonoscopy and biopsies taken. Hemoglobin remained stable. GI evaluated the patient and recommended PPI BID, pepcid 20mg  BID, bentyl, and carafate 1g QID. Patient is advised to avoid NSAIDs and pepto bismol, and also advised to follow up as an outpatient with gastroenterology to review biopsy results and to determine course of treatment. No indication for endoscopic evaluation at this time.    #Recurrent bilateral lower extremity DVT  Patient is on warfarin therapy for this. INR is subtherapeutic at 1.9 on admission. Held off on resumption of warfarin in setting of possible acute GI bleed. Will resume warfarin on 8/26 as Hb is stable and patient is not actively having a bleed.   #Chronic venous stasis ulcer  Patient has a chronic venous stasis ulcer of his left ankle for which he follows at wound care. He was last evaluated two days prior for this and recommended for continued wound dressing and follow up in 1 week.   #Restless leg syndrome Continued on home fluoxetine 40mg  daily and pramipexole 3mg  qHS   #Mood  disorder Continued on home risperidone 2mg  daily and trazodone 50mg  qHS   #Chronic pain  Continued home Lynox 5-300 twice daily prn

## 2021-01-14 NOTE — Discharge Instructions (Signed)
FOLLOW-UP INSTRUCTIONS:  Thank you for allowing Miguel Obrien to be part of your care. You were hospitalized for Gastritis and gastroduodenitis.  Please follow up with the following providers: A. Salli Quarry, PA-C, [address] / [place] [address], [phone]  B. Gastroenterology   Please note these changes made to your medications:   A. Medications to continue: Current Meds  Medication Sig   atorvastatin (LIPITOR) 10 MG tablet Take 10 mg by mouth daily.       famotidine (PEPCID) 20 MG tablet Take 20 mg by mouth 2 (two) times daily.   FLUoxetine (PROZAC) 40 MG capsule TAKE ONE CAPSULE BY MOUTH EVERY MORNING (Patient taking differently: Take 40 mg by mouth daily.)   gabapentin (NEURONTIN) 600 MG tablet Take 600-1,200 mg by mouth in the morning and at bedtime. 600mg  in the morning and 1200mg  at bedtime   risperiDONE (RISPERDAL) 2 MG tablet Take 1 tablet (2 mg total) by mouth at bedtime.   rOPINIRole (REQUIP) 0.25 MG tablet Take 0.25 mg by mouth 3 (three) times daily.   traZODone (DESYREL) 100 MG tablet Take 50-100 mg by mouth at bedtime as needed for sleep.   warfarin (COUMADIN) 10 MG tablet 10 mg. Take 1 tablet by mouth every Monday, Wednesday, and Friday (alternate with warfarin  7.5mg )   warfarin (COUMADIN) 7.5 MG tablet 7.5 mg. Take 1 tablet by mouth once every Tuesday, Thursday, Saturday, and Sunday (alternate with warfarin 10mg )                  B. Medications to start:     atorvastatin (LIPITOR) tablet 10 mg, 10 mg, Oral, Daily, Aslam, Sadia, MD, 10 mg at 01/14/21 0940   dicyclomine (BENTYL) tablet 20 mg, 20 mg, Oral, TID AC & HS, Beavers, Saturday, MD, 20 mg at 01/14/21 0940   FLUoxetine (PROZAC) capsule 40 mg, 40 mg, Oral, q morning, Aslam, Sadia, MD, 40 mg at 01/14/21 0940   gabapentin (NEURONTIN) capsule 600 mg, 600 mg, Oral, BID, Aslam, Sadia, MD, 600 mg at 01/14/21 0939   pantoprazole (PROTONIX) injection 40 mg, 40 mg, Intravenous, Q12H, 01/16/21 B, MD, 40 mg at  01/14/21 0940   risperiDONE (RISPERDAL) tablet 2 mg, 2 mg, Oral, QHS, Aslam, Sadia, MD, 2 mg at 01/13/21 2133   sucralfate (CARAFATE) 1 GM/10ML suspension 1 g, 1 g, Oral, TID WC & HS, 01/16/21, MD   traZODone (DESYREL) tablet 50-100 mg, 50-100 mg, Oral, QHS PRN, Aslam, Sadia, MD, 50 mg at 01/13/21 2133   C. Medications to discontinue:  Pepto bismol    Please make sure to follow up with your GI doctor regarding your recent EGD/colonoscopy results and to help medically manage your symptoms.   Please call our clinic if you have any questions or concerns, we may be able to help and keep you from a long and expensive emergency room wait. Our clinic and after hours phone number is [phone], the best time to call is Monday through Friday 9 am to 4 pm but there is always someone available 24/7 if you have an emergency. If you need medication refills please notify your pharmacy one week in advance and they will send [phone] a request.

## 2021-01-14 NOTE — Discharge Summary (Signed)
Name: Miguel Obrien MRN: [id number] DOB: 1970/12/16 50 y.o. PCP: Salli Quarry, PA-C  Date of Admission: 01/13/2021  7:00 AM Date of Discharge:  01/14/2021 Attending Physician: Dr.  Ninetta Lights  DISCHARGE DIAGNOSIS:  Primary Problem: Gastritis and gastroduodenitis   Hospital Problems: Principal Problem:   Gastritis and gastroduodenitis Active Problems:   Abdominal pain   Blood in the stool    DISCHARGE MEDICATIONS:   Allergies as of 01/14/2021       Reactions   Gentamycin [gentamicin]    Penicillins    Vancomycin Rash        Medication List     STOP taking these medications    bismuth subsalicylate 262 MG/15ML suspension Commonly known as: PEPTO BISMOL       TAKE these medications    atorvastatin 10 MG tablet Commonly known as: LIPITOR Take 10 mg by mouth daily.   dicyclomine 20 MG tablet Commonly known as: BENTYL Take 1 tablet (20 mg total) by mouth 4 (four) times daily -  before meals and at bedtime.   famotidine 20 MG tablet Commonly known as: PEPCID Take 20 mg by mouth 2 (two) times daily.   FLUoxetine 40 MG capsule Commonly known as: PROZAC TAKE ONE CAPSULE BY MOUTH EVERY MORNING What changed: when to take this   gabapentin 600 MG tablet Commonly known as: NEURONTIN Take 600-1,200 mg by mouth in the morning and at bedtime. 600mg  in the morning and 1200mg  at bedtime   pantoprazole 40 MG tablet Commonly known as: PROTONIX Take 1 tablet (40 mg total) by mouth 2 (two) times daily. What changed: when to take this   risperiDONE 2 MG tablet Commonly known as: RISPERDAL Take 1 tablet (2 mg total) by mouth at bedtime.   rOPINIRole 0.25 MG tablet Commonly known as: REQUIP Take 0.25 mg by mouth 3 (three) times daily.   sucralfate 1 g tablet Commonly known as: CARAFATE Take 1 tablet (1 g total) by mouth 4 (four) times daily -  with meals and at bedtime for 13 days.   traZODone 100 MG tablet Commonly known as: DESYREL Take 50-100 mg by mouth  at bedtime as needed for sleep.   warfarin 10 MG tablet Commonly known as: COUMADIN 10 mg. Take 1 tablet by mouth every Monday, Wednesday, and Friday (alternate with warfarin  7.5mg )   warfarin 7.5 MG tablet Commonly known as: COUMADIN 7.5 mg. Take 1 tablet by mouth once every Tuesday, Thursday, Saturday, and Sunday (alternate with warfarin 10mg )        DISPOSITION AND FOLLOW-UP:  Miguel Obrien was discharged from [facility] in Stable condition. At the hospital follow up visit please address:  Follow-up Recommendations: Consults: Gastroenterology Labs:  none Medications: Continue PPI twice daily, start pepcid 20mg  BID and carafate 1 g QID. STOP taking pepto bismol. STOP taking Mobic.   Follow-up Appointments: Advised to follow up with gastroenterologist who performed the patient's recent EGD and colonoscopy to discuss results and further treatment plan.   Follow-up Information     Wednesday, PA-C Follow up in 1 week(s).   Specialty: Physician Assistant Why: hospital follow up Contact information: [address] [place] Salli Quarry [phone]                 HOSPITAL COURSE:  Patient Summary: Miguel Obrien is a 50 y.o. 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  #Gastritis #Abdominal pain Patient presented with several months of epigastric abdominal pain with nausea. Work up thus far, including endoscopy and colonoscopy on 8/12 notable for a small hiatal hernia and polyp that was removed, biopsies pending. He has been on PPI and H2 blocker therapy; however, continues to have persistent symptoms. Additionally, patient is having melena over the past several days with 2-3 episodes of diarrhea daily. Melena is likely secondary to pepto bismol use. He presented to the ED on  8/13 and CT Abdomen/Pelvis wo contrast at the time without acute findings noted. Noted to have FOBT stools in the ED likely secondary to recent EGD/colonoscopy and biopsies taken. Hemoglobin remained stable. GI evaluated the patient and recommended PPI BID, pepcid 20mg  BID, bentyl, and carafate 1g QID. Patient is advised to avoid NSAIDs and pepto bismol, and also advised to follow up as an outpatient with gastroenterology to review biopsy results and to determine course of treatment. No indication for endoscopic evaluation at this time.    #Recurrent bilateral lower extremity DVT  Patient is on warfarin therapy for this. INR is subtherapeutic at 1.9 on admission. Held off on resumption of warfarin in setting of possible acute GI bleed. Will resume warfarin on 8/26 as Hb is stable and patient is not actively having a bleed.   #Chronic venous stasis ulcer  Patient has a chronic venous stasis ulcer of his left ankle for which he follows at wound care. He was last evaluated two days prior for this and recommended for continued wound dressing and follow up in 1 week.   #Restless leg syndrome Continued on home fluoxetine 40mg  daily and pramipexole 3mg  qHS   #Mood disorder Continued on home risperidone 2mg  daily and trazodone 50mg  qHS   #Chronic pain  Continued home Lynox 5-300 twice daily prn      DISCHARGE INSTRUCTIONS:    SUBJECTIVE:  Miguel Obrien was seen and evaluated on the day of discharge. He reports that his abdominal pain is improved, but still present. He is still having loose stools but notes that they are no longer dark in nature. Discussed the new medications that were started by GI, specifically, carafate, pepcid, PPI, and bentyl and how they will improve the patient's symptoms. Patient is advised to stop taking pepto bismol at this point, as this contributed to his dark stools. Also advised to follow up with PCP and the GI physician who performed the patient's recent EGD/colonoscopy on  discharge.   Discharge Vitals:   BP 132/76 (BP Location: Right Arm)   Pulse 72   Temp 98.4 F (36.9 C) (Oral)   Resp 18   Ht 5\' 10"  (1.778 m)   Wt (!) 150.6 kg   SpO2 97%   BMI 47.64 kg/m   OBJECTIVE:  Constitutional: Middle aged male, Appears well-developed and well-nourished. No distress.  Cardiovascular: Normal rate, regular rhythm, S1 and S2 present, no murmurs, rubs, gallops.  Distal pulses intact Respiratory: No respiratory distress, Effort is normal.  Lungs are clear to auscultation bilaterally. GI: Mildly distended, soft, diffuse tenderness to palpation most significant in epigastric and LUQ region, normal bowel sounds Musculoskeletal: Normal bulk and tone.  No peripheral edema noted. Neurological: Is alert and oriented x4, no apparent focal deficits noted. Psychiatric: Normal mood and affect.     Pertinent Labs, Studies, and Procedures:  CBC Latest Ref Rng & Units 01/14/2021 01/13/2021 07/01/2019  WBC 4.0 - 10.5 K/uL 9.2 9.3 11.0(H)  Hemoglobin  13.0 - 17.0 g/dL 16.113.9 09.615.0 04.514.8  Hematocrit 39.0 - 52.0 % 41.2 44.7 44.9  Platelets 150 - 400 K/uL 188 225 273    CMP Latest Ref Rng & Units 01/14/2021 01/13/2021 07/01/2019  Glucose 70 - 99 mg/dL 409(W123(H) 119(J121(H) 97  BUN 6 - 20 mg/dL 8 8 14   Creatinine 0.61 - 1.24 mg/dL 4.781.05 2.951.00 6.211.06  Sodium 135 - 145 mmol/L 132(L) 136 137  Potassium 3.5 - 5.1 mmol/L 3.3(L) 3.9 3.7  Chloride 98 - 111 mmol/L 102 104 108  CO2 22 - 32 mmol/L 20(L) 21(L) 22  Calcium 8.9 - 10.3 mg/dL 3.0(Q8.7(L) 9.5 6.5(H8.7(L)  Total Protein 6.5 - 8.1 g/dL - 6.7 7.0  Total Bilirubin 0.3 - 1.2 mg/dL - 0.9 0.9  Alkaline Phos 38 - 126 U/L - 39 51  AST 15 - 41 U/L - 30 36  ALT 0 - 44 U/L - 31 41    CT Angio Abd/Pel w/ and/or w/o  Result Date: 01/13/2021 CLINICAL DATA:  50 year old male with history of chronic mesenteric ischemia and melena. EXAM: CTA ABDOMEN AND PELVIS WITHOUT AND WITH CONTRAST TECHNIQUE: Multidetector CT imaging of the abdomen and pelvis was performed using  the standard protocol during bolus administration of intravenous contrast. Multiplanar reconstructed images and MIPs were obtained and reviewed to evaluate the vascular anatomy. CONTRAST:  100mL OMNIPAQUE IOHEXOL 350 MG/ML SOLN COMPARISON:  01/01/2021 FINDINGS: VASCULAR Aorta: Normal caliber aorta without aneurysm, dissection, vasculitis or significant stenosis. Celiac: Patent without evidence of aneurysm, dissection, vasculitis or significant stenosis. SMA: A replaced common hepatic artery arises from the proximal superior mesenteric artery. Patent without evidence of aneurysm, dissection, vasculitis or significant stenosis. Renals: Single bilateral renal arteries are patent without evidence of aneurysm, dissection, vasculitis, fibromuscular dysplasia or significant stenosis. IMA: Patent without evidence of aneurysm, dissection, vasculitis or significant stenosis. Inflow: Patent without evidence of aneurysm, dissection, vasculitis or significant stenosis. Proximal Outflow: Bilateral common femoral and visualized portions of the superficial and profunda femoral arteries are patent without evidence of aneurysm, dissection, vasculitis or significant stenosis. Veins: Patent hepatic veins. The portal system is widely patent and normal in caliber. No evidence of ileo caval thrombosis or anomalous configuration. The renal veins are patent. Review of the MIP images confirms the above findings. NON-VASCULAR Lower chest: No acute abnormality. Hepatobiliary: Diffusely decreased attenuation of the hepatic parenchyma. There are few scattered punctate calcifications. No focal masses. The gallbladder surgically absent. No intra or extrahepatic biliary ductal dilation. Pancreas: Unremarkable. No pancreatic ductal dilatation or surrounding inflammatory changes. Spleen: Normal in size without focal abnormality. Adrenals/Urinary Tract: Adrenal glands are unremarkable. Multifocal bilateral nonobstructive nephrolithiasis, the largest  in the left inferior pole measuring up to 5 mm. Kidneys are otherwise normal, without focal lesion or hydronephrosis. Bladder is unremarkable. Stomach/Bowel: Stomach is within normal limits. Appendix appears normal. No evidence of bowel wall thickening, distention, or inflammatory changes. No pneumatosis or evidence of active extravasation. Lymphatic: Prominent bilateral inguinal and iliac chain lymph nodes, for example left common iliac node measuring up to 1.9 cm, unchanged from recent comparison. Reproductive: Prostate is unremarkable. Other: No abdominal wall hernia or abnormality. No abdominopelvic ascites. Musculoskeletal: No acute or significant osseous findings. IMPRESSION: VASCULAR 1. No evidence of acute or chronic mesenteric ischemia. Widely patent celiac 2. No evidence of gastrointestinal hemorrhage. NON-VASCULAR 1. Moderate to severe hepatic steatosis. 2. Nonobstructive bilateral nephrolithiasis. Marliss Cootsylan Suttle, MD [facility] Electronically Signed   By: Marliss Cootsylan  Suttle M.D.   On: 01/13/2021  16:05     FOLLOW-UP INSTRUCTIONS:  Thank you for allowing [place] to be part of your care. You were hospitalized for Gastritis and gastroduodenitis.  Please follow up with the following providers: A. Salli Quarry, PA-C, [address] / [place] [address], [phone]  B. Gastroenterology   Please note these changes made to your medications:   A. Medications to continue: Current Meds  Medication Sig   atorvastatin (LIPITOR) 10 MG tablet Take 10 mg by mouth daily.       famotidine (PEPCID) 20 MG tablet Take 20 mg by mouth 2 (two) times daily.   FLUoxetine (PROZAC) 40 MG capsule TAKE ONE CAPSULE BY MOUTH EVERY MORNING (Patient taking differently: Take 40 mg by mouth daily.)   gabapentin (NEURONTIN) 600 MG tablet Take 600-1,200 mg by mouth in the morning and at bedtime. 600mg  in the morning and 1200mg  at bedtime   risperiDONE (RISPERDAL) 2  MG tablet Take 1 tablet (2 mg total) by mouth at bedtime.   rOPINIRole (REQUIP) 0.25 MG tablet Take 0.25 mg by mouth 3 (three) times daily.   traZODone (DESYREL) 100 MG tablet Take 50-100 mg by mouth at bedtime as needed for sleep.   warfarin (COUMADIN) 10 MG tablet 10 mg. Take 1 tablet by mouth every Monday, Wednesday, and Friday (alternate with warfarin  7.5mg )   warfarin (COUMADIN) 7.5 MG tablet 7.5 mg. Take 1 tablet by mouth once every Tuesday, Thursday, Saturday, and Sunday (alternate with warfarin 10mg )                  B. Medications to start:     atorvastatin (LIPITOR) tablet 10 mg, 10 mg, Oral, Daily, Aslam, Sadia, MD, 10 mg at 01/14/21 0940   dicyclomine (BENTYL) tablet 20 mg, 20 mg, Oral, TID AC & HS, Beavers, Friday, MD, 20 mg at 01/14/21 0940   FLUoxetine (PROZAC) capsule 40 mg, 40 mg, Oral, q morning, Aslam, Sadia, MD, 40 mg at 01/14/21 0940   gabapentin (NEURONTIN) capsule 600 mg, 600 mg, Oral, BID, Aslam, Sadia, MD, 600 mg at 01/14/21 0939   pantoprazole (PROTONIX) injection 40 mg, 40 mg, Intravenous, Q12H, 01/16/21 B, MD, 40 mg at 01/14/21 0940   risperiDONE (RISPERDAL) tablet 2 mg, 2 mg, Oral, QHS, Aslam, Sadia, MD, 2 mg at 01/13/21 2133   sucralfate (CARAFATE) 1 GM/10ML suspension 1 g, 1 g, Oral, TID WC & HS, 01/16/21, MD   traZODone (DESYREL) tablet 50-100 mg, 50-100 mg, Oral, QHS PRN, Aslam, Sadia, MD, 50 mg at 01/13/21 2133   C. Medications to discontinue:  Pepto bismol    Please make sure to follow up with your GI doctor regarding your recent EGD/colonoscopy results and to help medically manage your symptoms.   Please call our clinic if you have any questions or concerns, we may be able to help and keep you from a long and expensive emergency room wait. Our clinic and after hours phone number is [phone], the best time to call is Monday through Friday 9 am to 4 pm but there is always someone available 24/7 if you have an emergency. If you need  medication refills please notify your pharmacy one week in advance and they will send [phone] a request.       Signed: Saturday, D.O.  Internal Medicine Resident, PGY-1 [facility]  Pager: [phone] 11:20 AM, 01/14/2021

## 2021-01-14 NOTE — Progress Notes (Signed)
Discharge instructions reviewed with patient and wife. All questions answered. Awaiting his mom to bring his clothing.

## 2021-01-24 ENCOUNTER — Other Ambulatory Visit: Payer: Self-pay | Admitting: Internal Medicine

## 2021-12-17 ENCOUNTER — Other Ambulatory Visit: Payer: Self-pay | Admitting: Internal Medicine

## 2022-01-24 IMAGING — CT CT CTA ABD/PEL W/CM AND/OR W/O CM
2 of 9 series · 11 of 46 positions shown, 17 images · IV contrast (APPLIED)
Comparison: 01/01/2021

CLINICAL DATA: 49-year-old male with history of chronic mesenteric
ischemia and melena.

EXAM:
CTA ABDOMEN AND PELVIS WITHOUT AND WITH CONTRAST
TECHNIQUE: Multidetector CT imaging of the abdomen and pelvis was performed
using the standard protocol during bolus administration of
intravenous contrast. Multiplanar reconstructed images and MIPs were
obtained and reviewed to evaluate the vascular anatomy.
CONTRAST:  100mL OMNIPAQUE IOHEXOL 350 MG/ML SOLN

[Series 8: venous 5.0 i30f 1 · axial · portal-venous · 0.98mm/px · z∈[+785,+1210]mm · 9 of 107 slices shown, 15 images]
[im 11/107  soft-tissue]
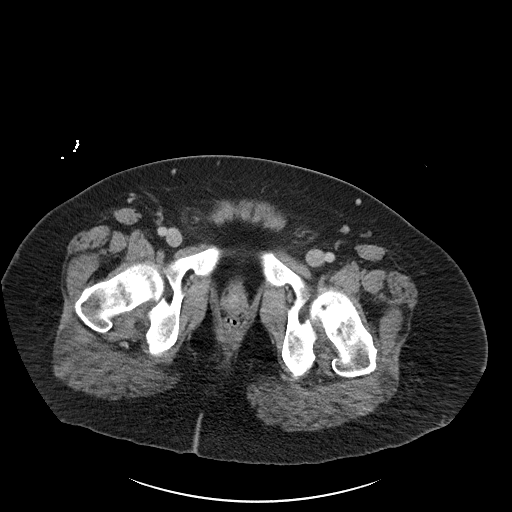
[im 11/107  bone]
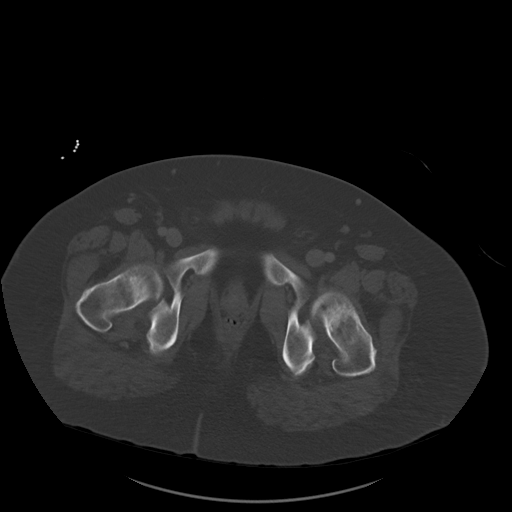
[im 22/107  soft-tissue]
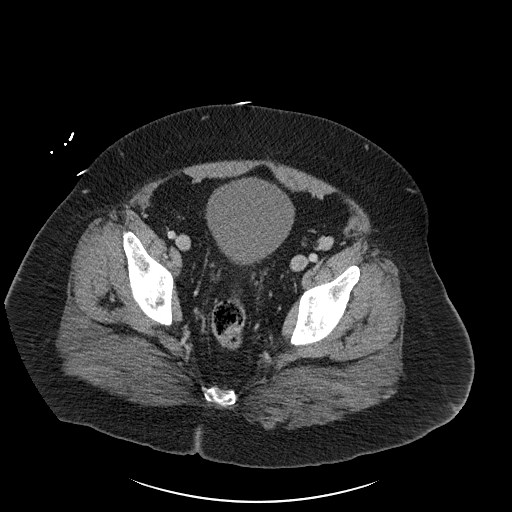
[im 32/107  soft-tissue]
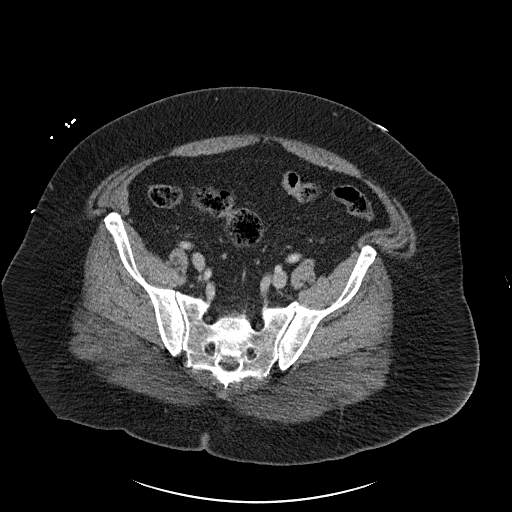
[im 43/107  soft-tissue]
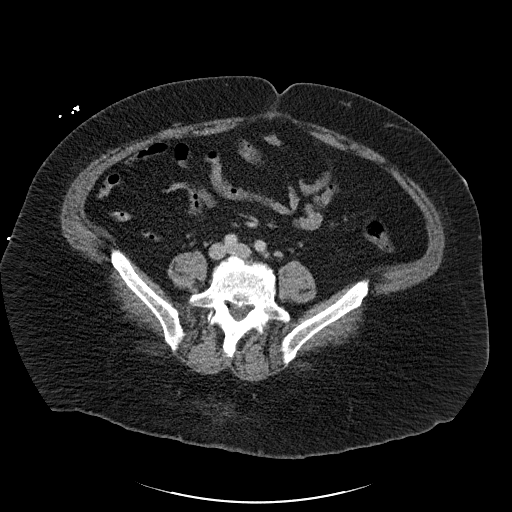
[im 54/107  soft-tissue]
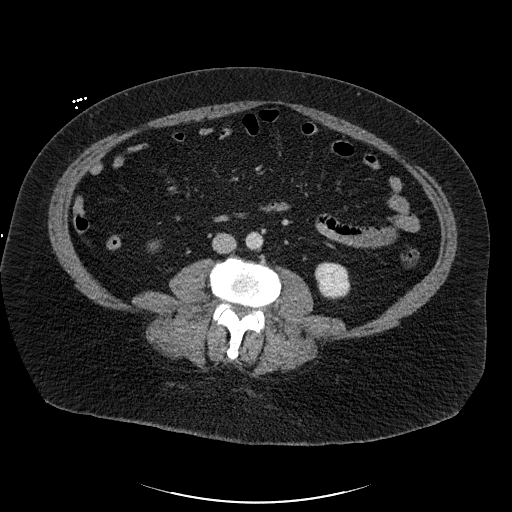
[im 64/107  soft-tissue]
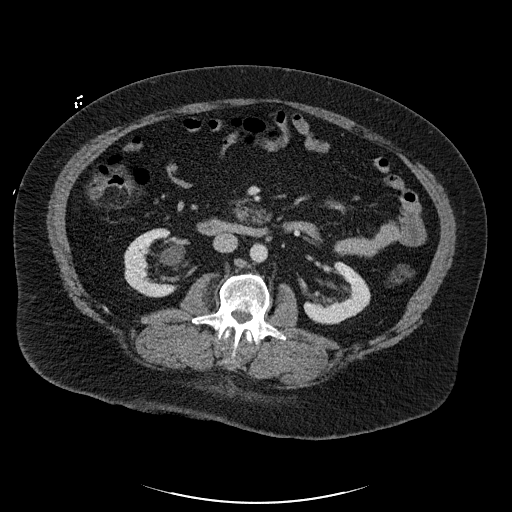
[im 64/107  lung]
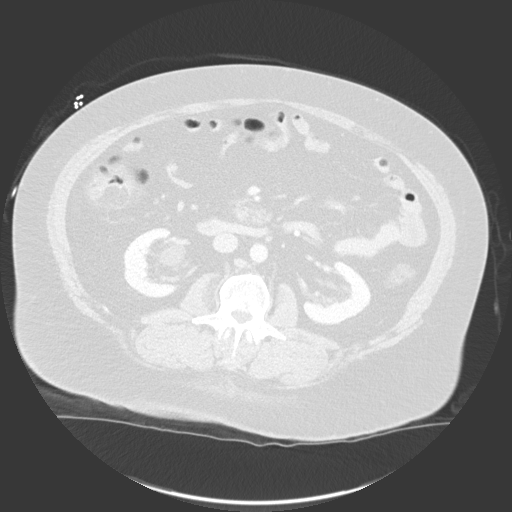
[im 75/107  soft-tissue]
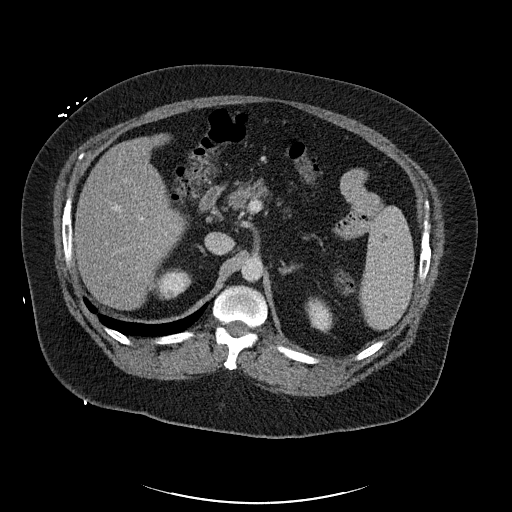
[im 75/107  lung]
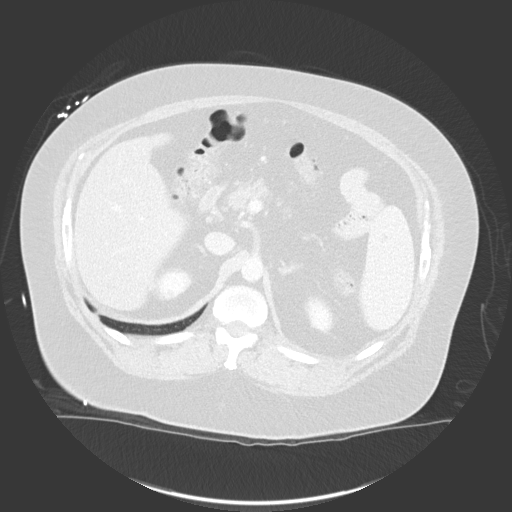
[im 85/107  soft-tissue]
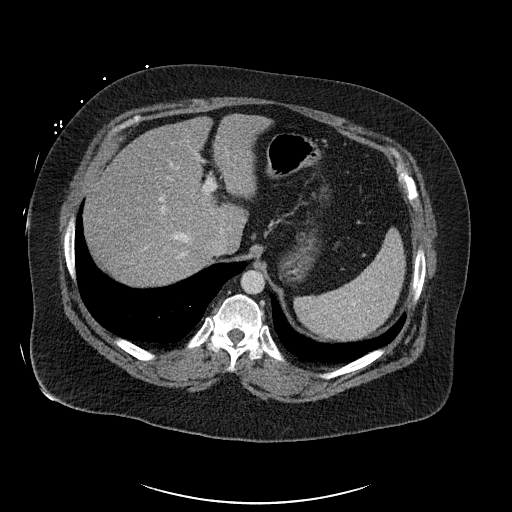
[im 85/107  lung]
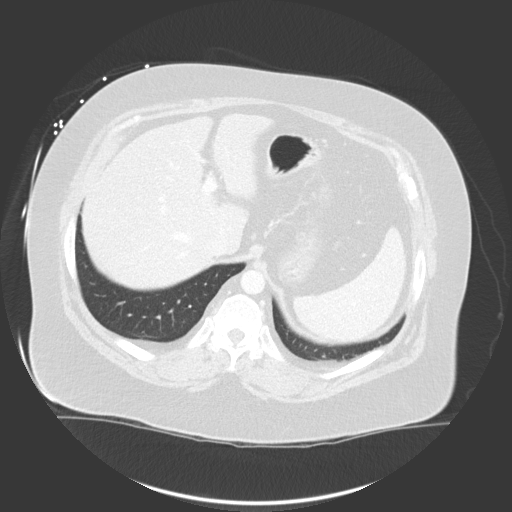
[im 96/107  soft-tissue]
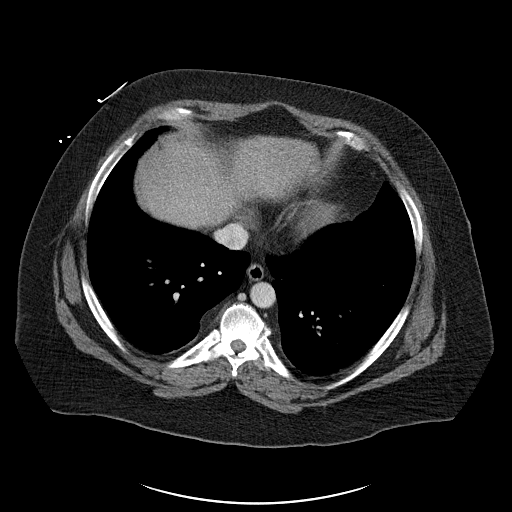
[im 96/107  lung]
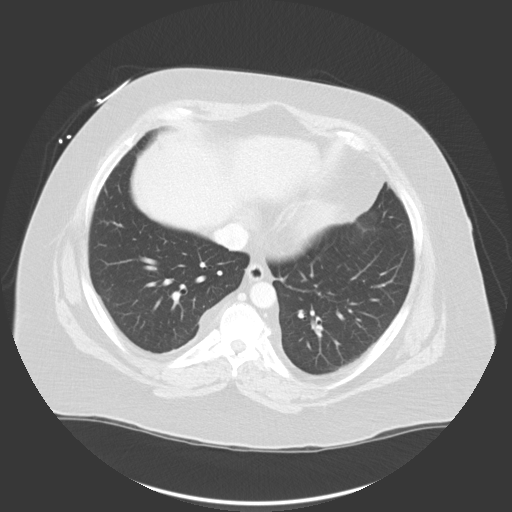
[im 96/107  bone]
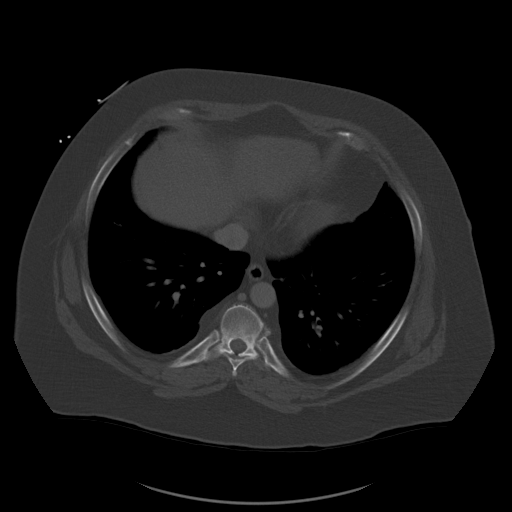

[Series 10: coronals · coronal · 1.04mm/px · 2 of 181 slices shown]
[im 61/181  soft-tissue]
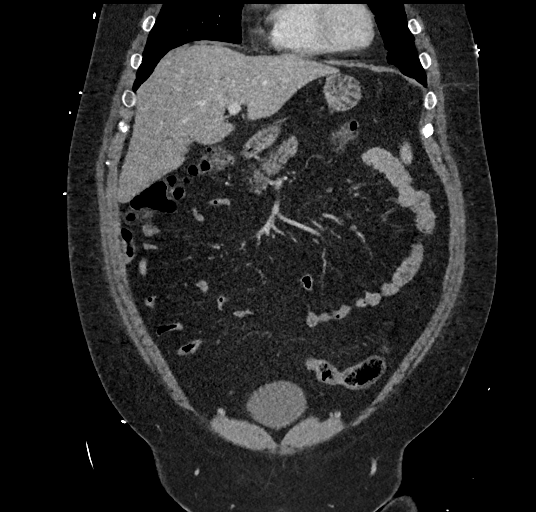
[im 121/181  soft-tissue]
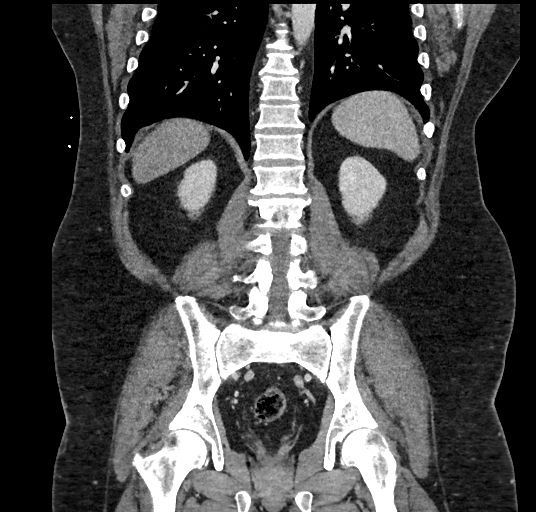

[11 of 46 positions shown; findings below may reference images not displayed]

FINDINGS: VASCULAR

Aorta: Normal caliber aorta without aneurysm, dissection, vasculitis
or significant stenosis.

Celiac: Patent without evidence of aneurysm, dissection, vasculitis
or significant stenosis.

SMA: A replaced common hepatic artery arises from the proximal
superior mesenteric artery. Patent without evidence of aneurysm,
dissection, vasculitis or significant stenosis.

Renals: Single bilateral renal arteries are patent without evidence
of aneurysm, dissection, vasculitis, fibromuscular dysplasia or
significant stenosis.

IMA: Patent without evidence of aneurysm, dissection, vasculitis or
significant stenosis.

Inflow: Patent without evidence of aneurysm, dissection, vasculitis
or significant stenosis.

Proximal Outflow: Bilateral common femoral and visualized portions
of the superficial and profunda femoral arteries are patent without
evidence of aneurysm, dissection, vasculitis or significant
stenosis.

Veins: Patent hepatic veins. The portal system is widely patent and
normal in caliber. No evidence of ileo caval thrombosis or anomalous
configuration. The renal veins are patent.

Review of the MIP images confirms the above findings.

NON-VASCULAR

Lower chest: No acute abnormality.

Hepatobiliary: Diffusely decreased attenuation of the hepatic
parenchyma. There are few scattered punctate calcifications. No
focal masses. The gallbladder surgically absent. No intra or
extrahepatic biliary ductal dilation.

Pancreas: Unremarkable. No pancreatic ductal dilatation or
surrounding inflammatory changes.

Spleen: Normal in size without focal abnormality.

Adrenals/Urinary Tract: Adrenal glands are unremarkable. Multifocal
bilateral nonobstructive nephrolithiasis, the largest in the left
inferior pole measuring up to 5 mm. Kidneys are otherwise normal,
without focal lesion or hydronephrosis. Bladder is unremarkable.

Stomach/Bowel: Stomach is within normal limits. Appendix appears
normal. No evidence of bowel wall thickening, distention, or
inflammatory changes. No pneumatosis or evidence of active
extravasation.

Lymphatic: Prominent bilateral inguinal and iliac chain lymph nodes,
for example left common iliac node measuring up to 1.9 cm, unchanged
from recent comparison.

Reproductive: Prostate is unremarkable.

Other: No abdominal wall hernia or abnormality. No abdominopelvic
ascites.

Musculoskeletal: No acute or significant osseous findings.
IMPRESSION: VASCULAR

1. No evidence of acute or chronic mesenteric ischemia. Widely
patent celiac
2. No evidence of gastrointestinal hemorrhage.

NON-VASCULAR

1. Moderate to severe hepatic steatosis.
2. Nonobstructive bilateral nephrolithiasis.
